# Patient Record
Sex: Male | Born: 1990 | Race: White | Hispanic: No | Marital: Single | Smoking: Former smoker
Health system: Southern US, Community
[De-identification: ages and names within clinical notes are randomized; demographics above are authoritative.]

---
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
## 2012-01-01 ENCOUNTER — Emergency Department (HOSPITAL_COMMUNITY): Payer: Medicaid Other

## 2012-01-01 ENCOUNTER — Emergency Department (HOSPITAL_COMMUNITY)
Admission: EM | Admit: 2012-01-01 | Discharge: 2012-01-01 | Disposition: A | Discharge status: Home / Self Care | Payer: Medicaid Other | Attending: Emergency Medicine | Admitting: Emergency Medicine

## 2012-01-01 ENCOUNTER — Encounter (HOSPITAL_COMMUNITY): Payer: Self-pay | Admitting: Family Medicine

## 2012-01-01 DIAGNOSIS — M25521 Pain in right elbow: Secondary | ICD-10-CM

## 2012-01-01 DIAGNOSIS — M19039 Primary osteoarthritis, unspecified wrist: Secondary | ICD-10-CM | POA: Insufficient documentation

## 2012-01-01 DIAGNOSIS — F17200 Nicotine dependence, unspecified, uncomplicated: Secondary | ICD-10-CM | POA: Insufficient documentation

## 2012-01-01 DIAGNOSIS — J45909 Unspecified asthma, uncomplicated: Secondary | ICD-10-CM | POA: Insufficient documentation

## 2012-01-01 MED ORDER — IBUPROFEN 800 MG PO TABS
800.0000 mg | ORAL_TABLET | Freq: Once | ORAL | Status: AC
  Administered 2012-01-01: 800 mg via ORAL
  Filled 2012-01-01: qty 1
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
## 2012-01-01 NOTE — ED Provider Notes (Signed)
Medical screening examination/treatment/procedure(s) were performed by non-physician practitioner and as supervising physician I was immediately available for consultation/collaboration.  Militza Devery M Cortne Amara, MD 01/01/12 2053 
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
## 2013-10-14 IMAGING — CR DG ELBOW COMPLETE 3+V*R*
4 series · 4 of 4 positions shown · non-contrast
Comparison: None.

CLINICAL DATA: Right elbow pain; twisting injury.

RIGHT ELBOW - COMPLETE 3+ VIEW

[x elbow ap right]
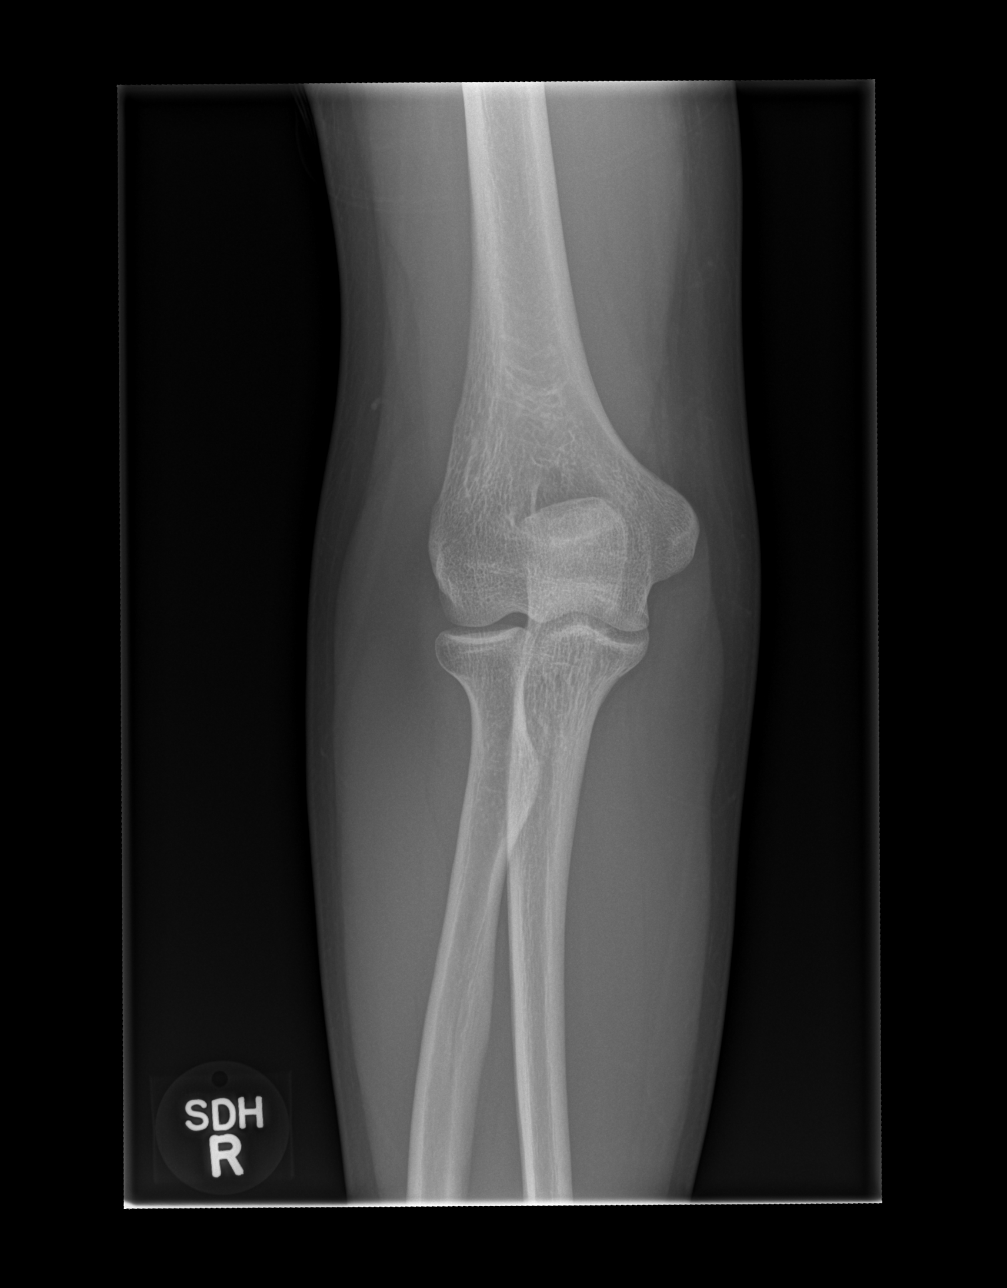

[x elbow obl right (1 of 2)]
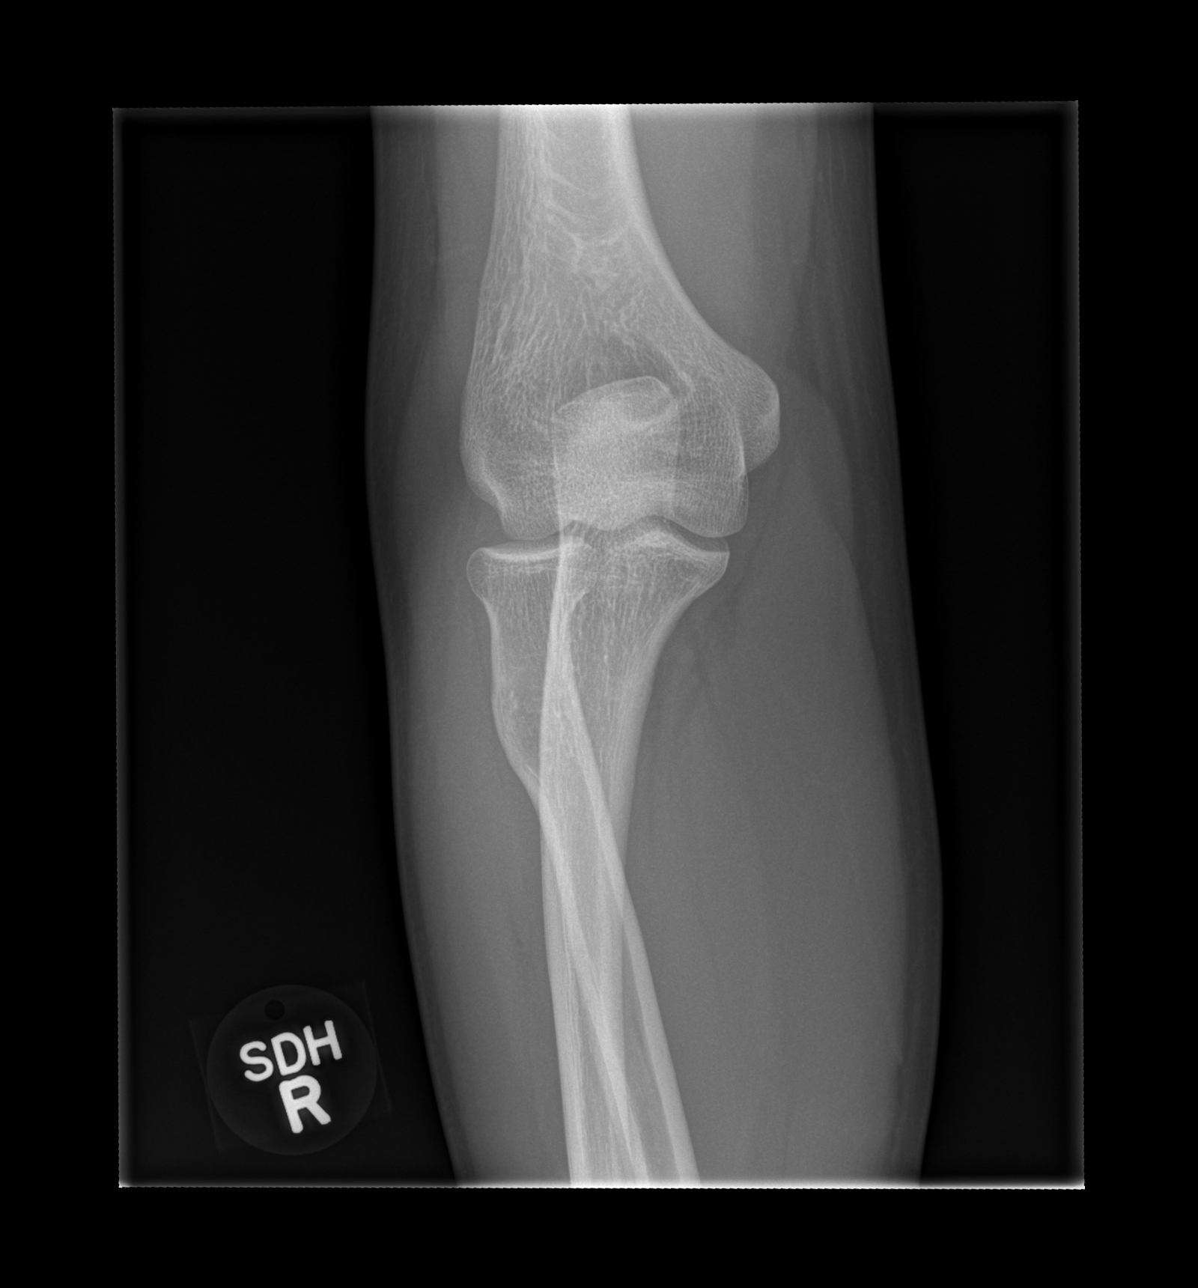

[x elbow obl right (2 of 2)]
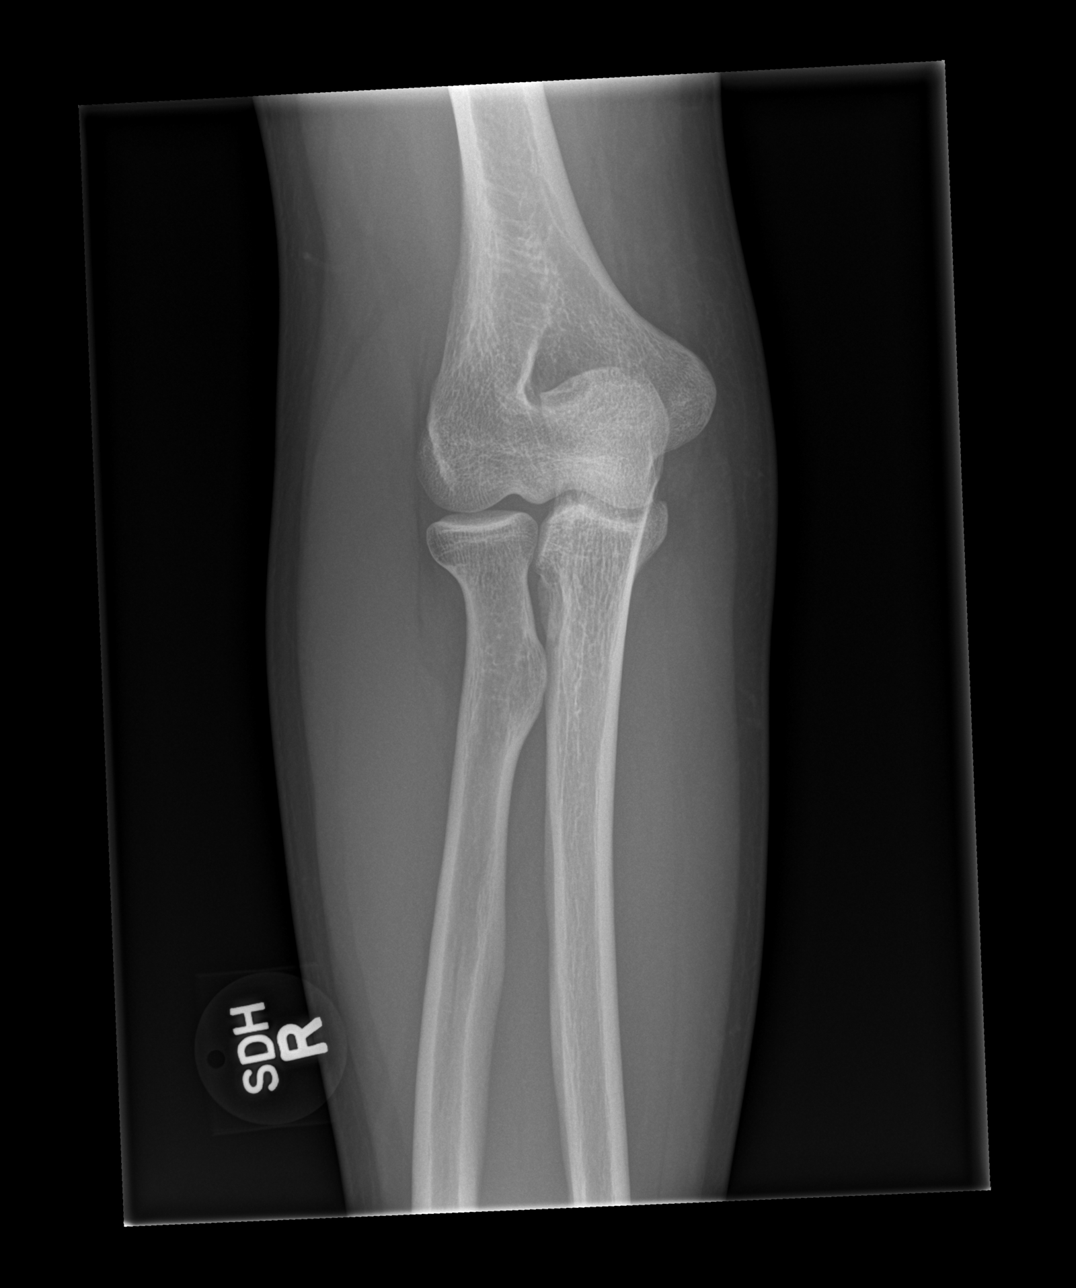

[x elbow lat right]
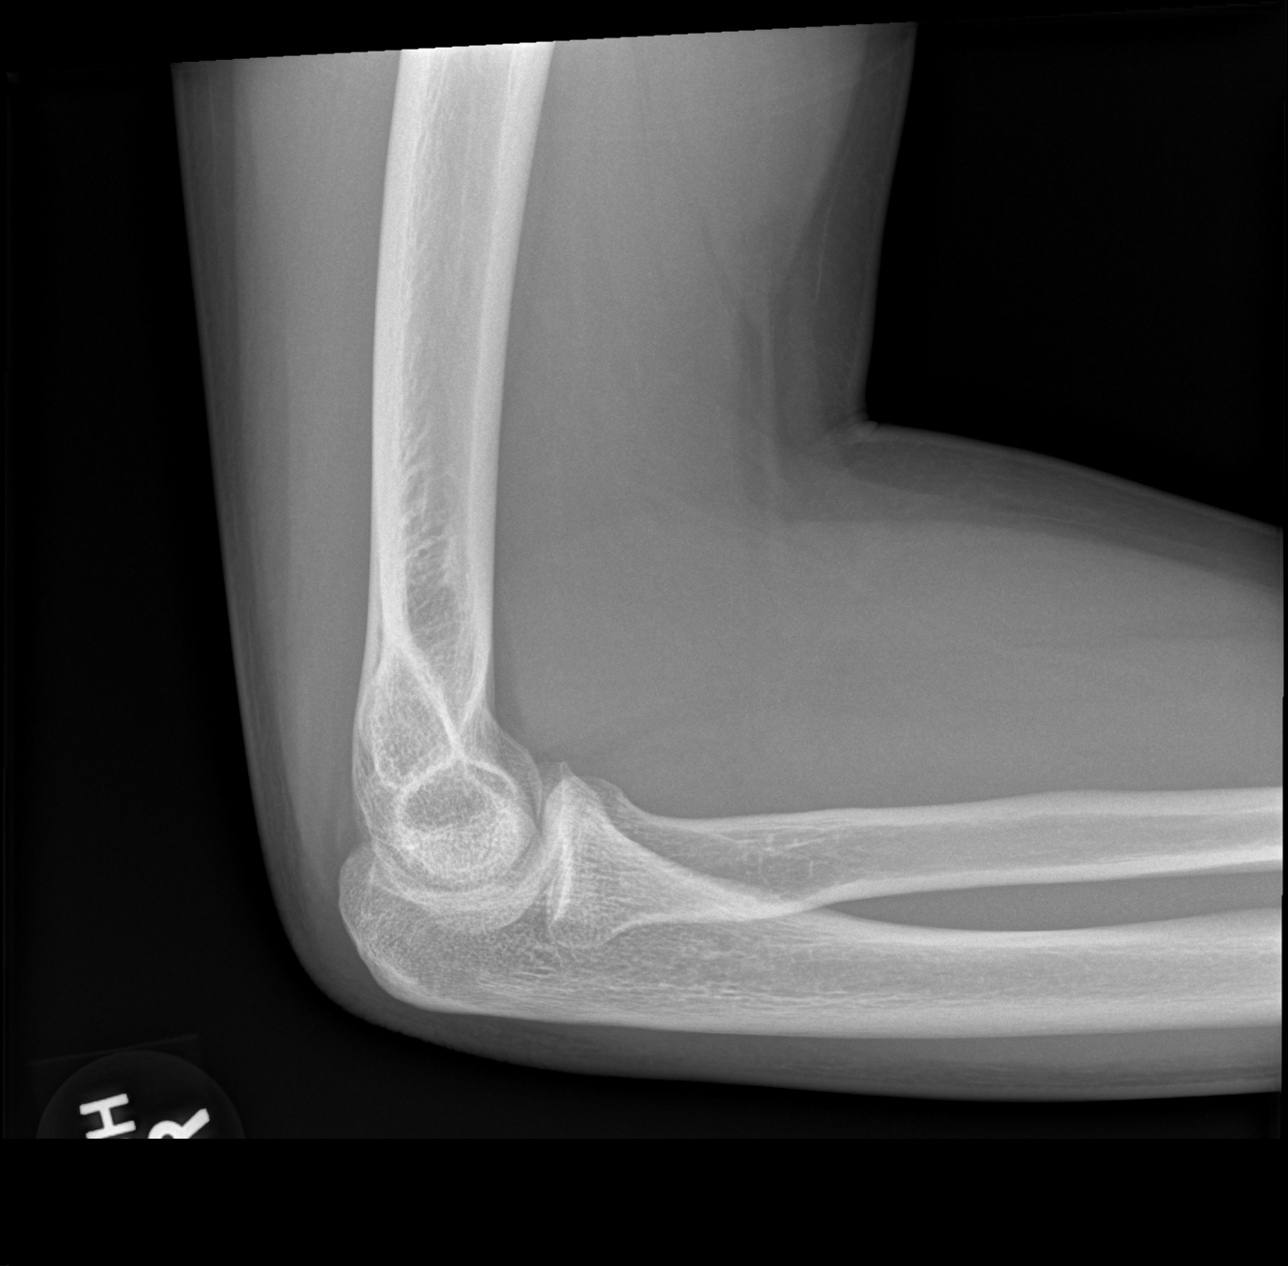

[4 of 4 positions shown; findings below may reference images not displayed]

FINDINGS: There is no evidence of fracture or dislocation.  The
visualized joint spaces are preserved.  No significant joint
effusion is identified.  The soft tissues are unremarkable in
appearance.
IMPRESSION: No evidence of fracture or dislocation.

## 2013-12-18 ENCOUNTER — Ambulatory Visit: Payer: Medicaid Other | Admitting: Internal Medicine

## 2014-08-03 ENCOUNTER — Emergency Department (HOSPITAL_COMMUNITY): Payer: Medicaid Other

## 2014-08-03 ENCOUNTER — Emergency Department (HOSPITAL_COMMUNITY)
Admission: EM | Admit: 2014-08-03 | Discharge: 2014-08-03 | Disposition: A | Discharge status: Home / Self Care | Payer: Medicaid Other | Attending: Emergency Medicine | Admitting: Emergency Medicine

## 2014-08-03 ENCOUNTER — Encounter (HOSPITAL_COMMUNITY): Payer: Self-pay | Admitting: *Deleted

## 2014-08-03 DIAGNOSIS — Y288XXA Contact with other sharp object, undetermined intent, initial encounter: Secondary | ICD-10-CM | POA: Insufficient documentation

## 2014-08-03 DIAGNOSIS — S91311A Laceration without foreign body, right foot, initial encounter: Secondary | ICD-10-CM

## 2014-08-03 DIAGNOSIS — Z720 Tobacco use: Secondary | ICD-10-CM | POA: Insufficient documentation

## 2014-08-03 DIAGNOSIS — Y998 Other external cause status: Secondary | ICD-10-CM | POA: Insufficient documentation

## 2014-08-03 DIAGNOSIS — Y9302 Activity, running: Secondary | ICD-10-CM | POA: Insufficient documentation

## 2014-08-03 DIAGNOSIS — Y9289 Other specified places as the place of occurrence of the external cause: Secondary | ICD-10-CM | POA: Insufficient documentation

## 2014-08-03 DIAGNOSIS — S91011A Laceration without foreign body, right ankle, initial encounter: Secondary | ICD-10-CM | POA: Insufficient documentation

## 2014-08-03 DIAGNOSIS — J45909 Unspecified asthma, uncomplicated: Secondary | ICD-10-CM | POA: Insufficient documentation

## 2014-08-03 MED ORDER — KETOROLAC TROMETHAMINE 60 MG/2ML IM SOLN
60.0000 mg | Freq: Once | INTRAMUSCULAR | Status: AC
  Administered 2014-08-03: 60 mg via INTRAMUSCULAR
  Filled 2014-08-03: qty 2

## 2014-08-03 MED ORDER — CEPHALEXIN 500 MG PO CAPS: 500.0000 mg | ORAL_CAPSULE | Freq: Four times a day (QID) | ORAL | Status: DC

## 2014-08-03 MED ORDER — BACITRACIN 500 UNIT/GM EX OINT
1.0000 "application " | TOPICAL_OINTMENT | Freq: Once | CUTANEOUS | Status: AC
  Administered 2014-08-03: 1 via TOPICAL

## 2014-08-03 MED ORDER — TETANUS-DIPHTH-ACELL PERTUSSIS 5-2.5-18.5 LF-MCG/0.5 IM SUSP
0.5000 mL | Freq: Once | INTRAMUSCULAR | Status: AC
  Administered 2014-08-03: 0.5 mL via INTRAMUSCULAR
  Filled 2014-08-03: qty 0.5
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
## 2014-08-03 NOTE — ED Notes (Signed)
Pt verbalizes understanding of d/c instructions and denies any further need at this time. 

## 2014-08-03 NOTE — ED Notes (Signed)
Pt states sore to R ankle x 3 days.  Mild red wound scabbed over immediately below R lat ankle.

## 2014-08-03 NOTE — Discharge Instructions (Signed)
Return to the emergency room with worsening of symptoms, new symptoms or with symptoms that are concerning, especially fevers, redness, swelling, red streaks, generalized unwell feeling. Read below information and follow recommendations. Laceration Care, Adult A laceration is a cut or lesion that goes through all layers of the skin and into the tissue just beneath the skin. TREATMENT  Some lacerations may not require closure. Some lacerations may not be able to be closed due to an increased risk of infection. It is important to see your caregiver as soon as possible after an injury to minimize the risk of infection and maximize the opportunity for successful closure. If closure is appropriate, pain medicines may be given, if needed. The wound will be cleaned to help prevent infection. Your caregiver will use stitches (sutures), staples, wound glue (adhesive), or skin adhesive strips to repair the laceration. These tools bring the skin edges together to allow for faster healing and a better cosmetic outcome. However, all wounds will heal with a scar. Once the wound has healed, scarring can be minimized by covering the wound with sunscreen during the day for 1 full year. HOME CARE INSTRUCTIONS  For sutures or staples:  Keep the wound clean and dry.  If you were given a bandage (dressing), you should change it at least once a day. Also, change the dressing if it becomes wet or dirty, or as directed by your caregiver.  Wash the wound with soap and water 2 times a day. Rinse the wound off with water to remove all soap. Pat the wound dry with a clean towel.  After cleaning, apply a thin layer of the antibiotic ointment as recommended by your caregiver. This will help prevent infection and keep the dressing from sticking.  You may shower as usual after the first 24 hours. Do not soak the wound in water until the sutures are removed.  Only take over-the-counter or prescription medicines for pain,  discomfort, or fever as directed by your caregiver.  Get your sutures or staples removed as directed by your caregiver. For skin adhesive strips:  Keep the wound clean and dry.  Do not get the skin adhesive strips wet. You may bathe carefully, using caution to keep the wound dry.  If the wound gets wet, pat it dry with a clean towel.  Skin adhesive strips will fall off on their own. You may trim the strips as the wound heals. Do not remove skin adhesive strips that are still stuck to the wound. They will fall off in time. For wound adhesive:  You may briefly wet your wound in the shower or bath. Do not soak or scrub the wound. Do not swim. Avoid periods of heavy perspiration until the skin adhesive has fallen off on its own. After showering or bathing, gently pat the wound dry with a clean towel.  Do not apply liquid medicine, cream medicine, or ointment medicine to your wound while the skin adhesive is in place. This may loosen the film before your wound is healed.  If a dressing is placed over the wound, be careful not to apply tape directly over the skin adhesive. This may cause the adhesive to be pulled off before the wound is healed.  Avoid prolonged exposure to sunlight or tanning lamps while the skin adhesive is in place. Exposure to ultraviolet light in the first year will darken the scar.  The skin adhesive will usually remain in place for 5 to 10 days, then naturally fall off the skin.  Do not pick at the adhesive film. You may need a tetanus shot if:  You cannot remember when you had your last tetanus shot.  You have never had a tetanus shot. If you get a tetanus shot, your arm may swell, get red, and feel warm to the touch. This is common and not a problem. If you need a tetanus shot and you choose not to have one, there is a rare chance of getting tetanus. Sickness from tetanus can be serious. SEEK MEDICAL CARE IF:   You have redness, swelling, or increasing pain in the  wound.  You see a red line that goes away from the wound.  You have yellowish-white fluid (pus) coming from the wound.  You have a fever.  You notice a bad smell coming from the wound or dressing.  Your wound breaks open before or after sutures have been removed.  You notice something coming out of the wound such as wood or glass.  Your wound is on your hand or foot and you cannot move a finger or toe. SEEK IMMEDIATE MEDICAL CARE IF:   Your pain is not controlled with prescribed medicine.  You have severe swelling around the wound causing pain and numbness or a change in color in your arm, hand, leg, or foot.  Your wound splits open and starts bleeding.  You have worsening numbness, weakness, or loss of function of any joint around or beyond the wound.  You develop painful lumps near the wound or on the skin anywhere on your body. MAKE SURE YOU:   Understand these instructions.  Will watch your condition.  Will get help right away if you are not doing well or get worse. Document Released: 01/18/2005 Document Revised: 04/12/2011 Document Reviewed: 07/14/2010 [facility] Patient Information 2015 [place], [place]. This information is not intended to replace advice given to you by your health care provider. Make sure you discuss any questions you have with your health care provider.

## 2014-08-03 NOTE — ED Notes (Signed)
The patient is back from xray.

## 2015-03-13 ENCOUNTER — Emergency Department (INDEPENDENT_AMBULATORY_CARE_PROVIDER_SITE_OTHER)
Admission: EM | Admit: 2015-03-13 | Discharge: 2015-03-13 | Disposition: A | Discharge status: Home / Self Care | Payer: Self-pay | Source: Home / Self Care | Attending: Family Medicine | Admitting: Family Medicine

## 2015-03-13 ENCOUNTER — Encounter (HOSPITAL_COMMUNITY): Payer: Self-pay | Admitting: *Deleted

## 2015-03-13 DIAGNOSIS — J069 Acute upper respiratory infection, unspecified: Secondary | ICD-10-CM

## 2015-03-13 NOTE — ED Provider Notes (Signed)
CSN: [id number]     Arrival date & time 03/13/15  1312 History   First MD Initiated Contact with Patient 03/13/15 1430     Chief Complaint  Patient presents with  . Cough   (Consider location/radiation/quality/duration/timing/severity/associated sxs/prior Treatment) HPI URI type symptoms for 2 days now. Runny nose cough fever. Symptomatic treatment at home with over-the-counter medications which are helping somewhat. Past Medical History  Diagnosis Date  . Asthma    History reviewed. No pertinent past surgical history. History reviewed. No pertinent family history. Social History  Substance Use Topics  . Smoking status: Current Every Day Smoker -- 1.50 packs/day    Types: Cigarettes  . Smokeless tobacco: None  . Alcohol Use: 1.8 oz/week    3 Cans of beer per week    Review of Systems ROS +'ve URI symptoms.  Denies: HEADACHE, NAUSEA, ABDOMINAL PAIN, CHEST PAIN, CONGESTION, DYSURIA, SHORTNESS OF BREATH  Allergies  Review of patient's allergies indicates no known allergies.  Home Medications   Prior to Admission medications   Medication Sig Start Date End Date Taking? Authorizing Provider  cephALEXin (KEFLEX) 500 MG capsule Take 1 capsule (500 mg total) by mouth 4 (four) times daily. 08/03/14   Oswaldo Conroy, PA-C  ibuprofen (ADVIL,MOTRIN) 600 MG tablet One (1) po q 6 hours prn, with food 01/28/11   Cathren Laine, MD   Meds Ordered and Administered this Visit  Medications - No data to display  BP 147/62 mmHg  Pulse 92  Temp(Src) 98.8 F (37.1 C) (Oral)  Resp 16  SpO2 99% No data found.   Physical Exam  Constitutional: He is oriented to person, place, and time. He appears well-developed and well-nourished.  HENT:  Head: Normocephalic and atraumatic.  Right Ear: External ear normal.  Left Ear: External ear normal.  Mouth/Throat: Oropharynx is clear and moist.  Eyes: Conjunctivae are normal.  Neck: Normal range of motion. Neck supple.  Pulmonary/Chest: Effort  normal and breath sounds normal.  Musculoskeletal: Normal range of motion.  Lymphadenopathy:    He has no cervical adenopathy.  Neurological: He is alert and oriented to person, place, and time.  Skin: Skin is warm and dry.  Psychiatric: He has a normal mood and affect. His behavior is normal.  Nursing note and vitals reviewed.   ED Course  Procedures (including critical care time)  Labs Review Labs Reviewed - No data to display  Imaging Review No results found.   Visual Acuity Review  Right Eye Distance:   Left Eye Distance:   Bilateral Distance:    Right Eye Near:   Left Eye Near:    Bilateral Near:         MDM   1. Acute URI    Patient is advised to continue home symptomatic treatment.  Patient is advised that if there are new or worsening symptoms or attend the emergency department, or contact primary care provider. Instructions of care provided discharged home in stable condition. Return to work/school note provided.  THIS NOTE WAS GENERATED USING A VOICE RECOGNITION SOFTWARE PROGRAM. ALL REASONABLE EFFORTS  WERE MADE TO PROOFREAD THIS DOCUMENT FOR ACCURACY.     Tharon Aquas, PA 03/13/15 2031

## 2015-03-13 NOTE — Discharge Instructions (Signed)
Upper Respiratory Infection, Adult °Most upper respiratory infections (URIs) are a viral infection of the air passages leading to the lungs. A URI affects the nose, throat, and upper air passages. The most common type of URI is nasopharyngitis and is typically referred to as "the common cold." °URIs run their course and usually go away on their own. Most of the time, a URI does not require medical attention, but sometimes a bacterial infection in the upper airways can follow a viral infection. This is called a secondary infection. Sinus and middle ear infections are common types of secondary upper respiratory infections. °Bacterial pneumonia can also complicate a URI. A URI can worsen asthma and chronic obstructive pulmonary disease (COPD). Sometimes, these complications can require emergency medical care and may be life threatening.  °CAUSES °Almost all URIs are caused by viruses. A virus is a type of germ and can spread from one person to another.  °RISKS FACTORS °You may be at risk for a URI if:  °· You smoke.   °· You have chronic heart or lung disease. °· You have a weakened defense (immune) system.   °· You are very young or very old.   °· You have nasal allergies or asthma. °· You work in crowded or poorly ventilated areas. °· You work in health care facilities or schools. °SIGNS AND SYMPTOMS  °Symptoms typically develop 2-3 days after you come in contact with a cold virus. Most viral URIs last 7-10 days. However, viral URIs from the influenza virus (flu virus) can last 14-18 days and are typically more severe. Symptoms may include:  °· Runny or stuffy (congested) nose.   °· Sneezing.   °· Cough.   °· Sore throat.   °· Headache.   °· Fatigue.   °· Fever.   °· Loss of appetite.   °· Pain in your forehead, behind your eyes, and over your cheekbones (sinus pain). °· Muscle aches.   °DIAGNOSIS  °Your health care provider may diagnose a URI by: °· Physical exam. °· Tests to check that your symptoms are not due to  another condition such as: °· Strep throat. °· Sinusitis. °· Pneumonia. °· Asthma. °TREATMENT  °A URI goes away on its own with time. It cannot be cured with medicines, but medicines may be prescribed or recommended to relieve symptoms. Medicines may help: °· Reduce your fever. °· Reduce your cough. °· Relieve nasal congestion. °HOME CARE INSTRUCTIONS  °· Take medicines only as directed by your health care provider.   °· Gargle warm saltwater or take cough drops to comfort your throat as directed by your health care provider. °· Use a warm mist humidifier or inhale steam from a shower to increase air moisture. This may make it easier to breathe. °· Drink enough fluid to keep your urine clear or pale yellow.   °· Eat soups and other clear broths and maintain good nutrition.   °· Rest as needed.   °· Return to work when your temperature has returned to normal or as your health care provider advises. You may need to stay home longer to avoid infecting others. You can also use a face mask and careful hand washing to prevent spread of the virus. °· Increase the usage of your inhaler if you have asthma.   °· Do not use any tobacco products, including cigarettes, chewing tobacco, or electronic cigarettes. If you need help quitting, ask your health care provider. °PREVENTION  °The best way to protect yourself from getting a cold is to practice good hygiene.  °· Avoid oral or hand contact with people with cold   symptoms.   °· Wash your hands often if contact occurs.   °There is no clear evidence that vitamin C, vitamin E, echinacea, or exercise reduces the chance of developing a cold. However, it is always recommended to get plenty of rest, exercise, and practice good nutrition.  °SEEK MEDICAL CARE IF:  °· You are getting worse rather than better.   °· Your symptoms are not controlled by medicine.   °· You have chills. °· You have worsening shortness of breath. °· You have brown or red mucus. °· You have yellow or brown nasal  discharge. °· You have pain in your face, especially when you bend forward. °· You have a fever. °· You have swollen neck glands. °· You have pain while swallowing. °· You have white areas in the back of your throat. °SEEK IMMEDIATE MEDICAL CARE IF:  °· You have severe or persistent: °¨ Headache. °¨ Ear pain. °¨ Sinus pain. °¨ Chest pain. °· You have chronic lung disease and any of the following: °¨ Wheezing. °¨ Prolonged cough. °¨ Coughing up blood. °¨ A change in your usual mucus. °· You have a stiff neck. °· You have changes in your: °¨ Vision. °¨ Hearing. °¨ Thinking. °¨ Mood. °MAKE SURE YOU:  °· Understand these instructions. °· Will watch your condition. °· Will get help right away if you are not doing well or get worse. °  °This information is not intended to replace advice given to you by your health care provider. Make sure you discuss any questions you have with your health care provider. °  °Document Released: 07/14/2000 Document Revised: 06/04/2014 Document Reviewed: 04/25/2013 °Elsevier Interactive Patient Education ©2016 [employer] ° °Cough, Adult °A cough helps to clear your throat and lungs. A cough may last only 2-3 weeks (acute), or it may last longer than 8 weeks (chronic). Many different things can cause a cough. A cough may be a sign of an illness or another medical condition. °HOME CARE °· Pay attention to any changes in your cough. °· Take medicines only as told by your doctor. °¨ If you were prescribed an antibiotic medicine, take it as told by your doctor. Do not stop taking it even if you start to feel better. °¨ Talk with your doctor before you try using a cough medicine. °· Drink enough fluid to keep your pee (urine) clear or pale yellow. °· If the air is dry, use a cold steam vaporizer or humidifier in your home. °· Stay away from things that make you cough at work or at home. °· If your cough is worse at night, try using extra pillows to raise your head up higher while you  sleep. °· Do not smoke, and try not to be around smoke. If you need help quitting, ask your doctor. °· Do not have caffeine. °· Do not drink alcohol. °· Rest as needed. °GET HELP IF: °· You have new problems (symptoms). °· You cough up yellow fluid (pus). °· Your cough does not get better after 2-3 weeks, or your cough gets worse. °· Medicine does not help your cough and you are not sleeping well. °· You have pain that gets worse or pain that is not helped with medicine. °· You have a fever. °· You are losing weight and you do not know why. °· You have night sweats. °GET HELP RIGHT AWAY IF: °· You cough up blood. °· You have trouble breathing. °· Your heartbeat is very fast. °  °This information is not intended to replace   advice given to you by your health care provider. Make sure you discuss any questions you have with your health care provider. °  °Document Released: 10/01/2010 Document Revised: 10/09/2014 Document Reviewed: 03/27/2014 °Elsevier Interactive Patient Education ©2016 [employer] ° °

## 2015-03-13 NOTE — ED Notes (Signed)
Pt  Reports      Symptoms    Of   Cough    boody  Aches      Chest pain  With  Coughing  With  Onset  Of  Symptoms  For  Several  Days            pt  Reports  The  Cough  Is  For  The  Most  Part  Productive         He  Is  A  Smoker   1/2  Ppd    hwe is  Sitting  Upright  On  The  Exam table  Speaking in  Complete  sentances

## 2015-06-18 ENCOUNTER — Encounter (HOSPITAL_COMMUNITY): Payer: Self-pay | Admitting: Emergency Medicine

## 2015-06-18 ENCOUNTER — Ambulatory Visit (HOSPITAL_COMMUNITY)
Admission: EM | Admit: 2015-06-18 | Discharge: 2015-06-18 | Disposition: A | Discharge status: Home / Self Care | Payer: 59 | Attending: Emergency Medicine | Admitting: Emergency Medicine

## 2015-06-18 DIAGNOSIS — H6502 Acute serous otitis media, left ear: Secondary | ICD-10-CM

## 2015-06-18 DIAGNOSIS — J40 Bronchitis, not specified as acute or chronic: Secondary | ICD-10-CM

## 2015-06-18 MED ORDER — IPRATROPIUM-ALBUTEROL 0.5-2.5 (3) MG/3ML IN SOLN
3.0000 mL | Freq: Once | RESPIRATORY_TRACT | Status: AC
  Administered 2015-06-18: 3 mL via RESPIRATORY_TRACT

## 2015-06-18 MED ORDER — AMOXICILLIN 500 MG PO CAPS: 500.0000 mg | ORAL_CAPSULE | Freq: Three times a day (TID) | ORAL | Status: DC

## 2015-06-18 MED ORDER — PREDNISONE 50 MG PO TABS: ORAL_TABLET | ORAL | Status: DC

## 2015-06-18 MED ORDER — IPRATROPIUM-ALBUTEROL 0.5-2.5 (3) MG/3ML IN SOLN
RESPIRATORY_TRACT | Status: AC
  Filled 2015-06-18: qty 3

## 2015-06-18 MED ORDER — ALBUTEROL SULFATE HFA 108 (90 BASE) MCG/ACT IN AERS: 2.0000 | INHALATION_SPRAY | RESPIRATORY_TRACT | Status: DC | PRN

## 2015-06-18 NOTE — Discharge Instructions (Signed)
You have bronchitis. Take amoxicillin and prednisone as prescribed. Use the albuterol every 4 hours as needed for wheezing or cough. You also have some fluid behind your left ear. This is likely contributing to the dizziness you had today. The amoxicillin will help with this. You should see improvement in the next 3-5 days. If you develop fevers, difficulty breathing, or are just not getting better, please come back or go to the emergency room.

## 2015-06-18 NOTE — ED Notes (Signed)
Pt has been suffering from head congestion and a cough for two days.  Today he stated he felt very dizzy and thought he was going to pass out at work.  He does not know if he has had a fever the last two days, but his temperature today is 100.5 with no medications taken.

## 2015-06-18 NOTE — ED Provider Notes (Signed)
CSN: [id number]     Arrival date & time 06/18/15  1744 History   First MD Initiated Contact with Patient 06/18/15 1916     Chief Complaint  Patient presents with  . URI  . Dizziness   (Consider location/radiation/quality/duration/timing/severity/associated sxs/prior Treatment) HPI He is a 25 year old man here for evaluation of cough and dizziness. His symptoms started 2 days ago with mild nasal congestion, rhinorrhea and cough. His symptoms worsened yesterday. He describes a spasmodic cough. He's also had some shortness of breath. He denies any wheezing. He reports feeling feverish yesterday, but did not take a temperature. Today at work he had an episode of dizziness. He states it felt like the room was spinning. It was associated with some mild nausea. It lasted about 3 minutes. He does have a history of asthma, but has not used an inhaler in several years. He is a current smoker.  Past Medical History  Diagnosis Date  . Asthma    History reviewed. No pertinent past surgical history. History reviewed. No pertinent family history. Social History  Substance Use Topics  . Smoking status: Current Every Day Smoker -- 1.00 packs/day    Types: Cigarettes  . Smokeless tobacco: None  . Alcohol Use: 1.8 oz/week    3 Cans of beer per week    Review of Systems As in history of present illness Allergies  Review of patient's allergies indicates no known allergies.  Home Medications   Prior to Admission medications   Medication Sig Start Date End Date Taking? Authorizing Provider  Pseudoeph-Doxylamine-DM-APAP (NYQUIL PO) Take by mouth.   Yes Historical Provider, MD  albuterol (PROVENTIL HFA;VENTOLIN HFA) 108 (90 Base) MCG/ACT inhaler Inhale 2 puffs into the lungs every 4 (four) hours as needed for wheezing or shortness of breath. 06/18/15   Charm Rings, MD  amoxicillin (AMOXIL) 500 MG capsule Take 1 capsule (500 mg total) by mouth 3 (three) times daily. 06/18/15   Charm Rings, MD  ibuprofen  (ADVIL,MOTRIN) 600 MG tablet One (1) po q 6 hours prn, with food 01/28/11   Cathren Laine, MD  predniSONE (DELTASONE) 50 MG tablet Take 1 pill daily for 5 days. 06/18/15   Charm Rings, MD   Meds Ordered and Administered this Visit   Medications  ipratropium-albuterol (DUONEB) 0.5-2.5 (3) MG/3ML nebulizer solution 3 mL (3 mLs Nebulization Given 06/18/15 1935)    BP 122/79 mmHg  Pulse 98  Temp(Src) 100.5 F (38.1 C) (Oral)  SpO2 99% No data found.   Physical Exam  Constitutional: He is oriented to person, place, and time. He appears well-developed and well-nourished. No distress.  HENT:  Mouth/Throat: No oropharyngeal exudate.  Oropharynx is erythematous. Nasal mucosa is erythematous and boggy. Right TM is normal. Left TM is erythematous with a clear effusion.  Neck: Neck supple.  Cardiovascular: Normal rate, regular rhythm and normal heart sounds.   No murmur heard. Pulmonary/Chest: Effort normal and breath sounds normal. No respiratory distress. He has no wheezes. He has no rales.  He does have a spastic cough  Lymphadenopathy:    He has no cervical adenopathy.  Neurological: He is alert and oriented to person, place, and time.    ED Course  Procedures (including critical care time)  Labs Review Labs Reviewed - No data to display  Imaging Review No results found.   MDM   1. Bronchitis   2. Acute serous otitis media of left ear, recurrence not specified    Reports subjective improvement after DuoNeb. Cough  does sound looser after DuoNeb.  Treat with amoxicillin, prednisone, and albuterol. Follow-up as needed.    Charm RingsErin J Aisling Emigh, MD 06/18/15 2009

## 2016-05-16 IMAGING — CR DG FOOT COMPLETE 3+V*R*
3 series · 3 of 3 positions shown · non-contrast
Comparison: Right foot radiographs performed 08/25/2004

CLINICAL DATA: Right lateral ankle laceration and pain. Initial
encounter.

EXAM:
RIGHT FOOT COMPLETE - 3+ VIEW

[x foot ap right]
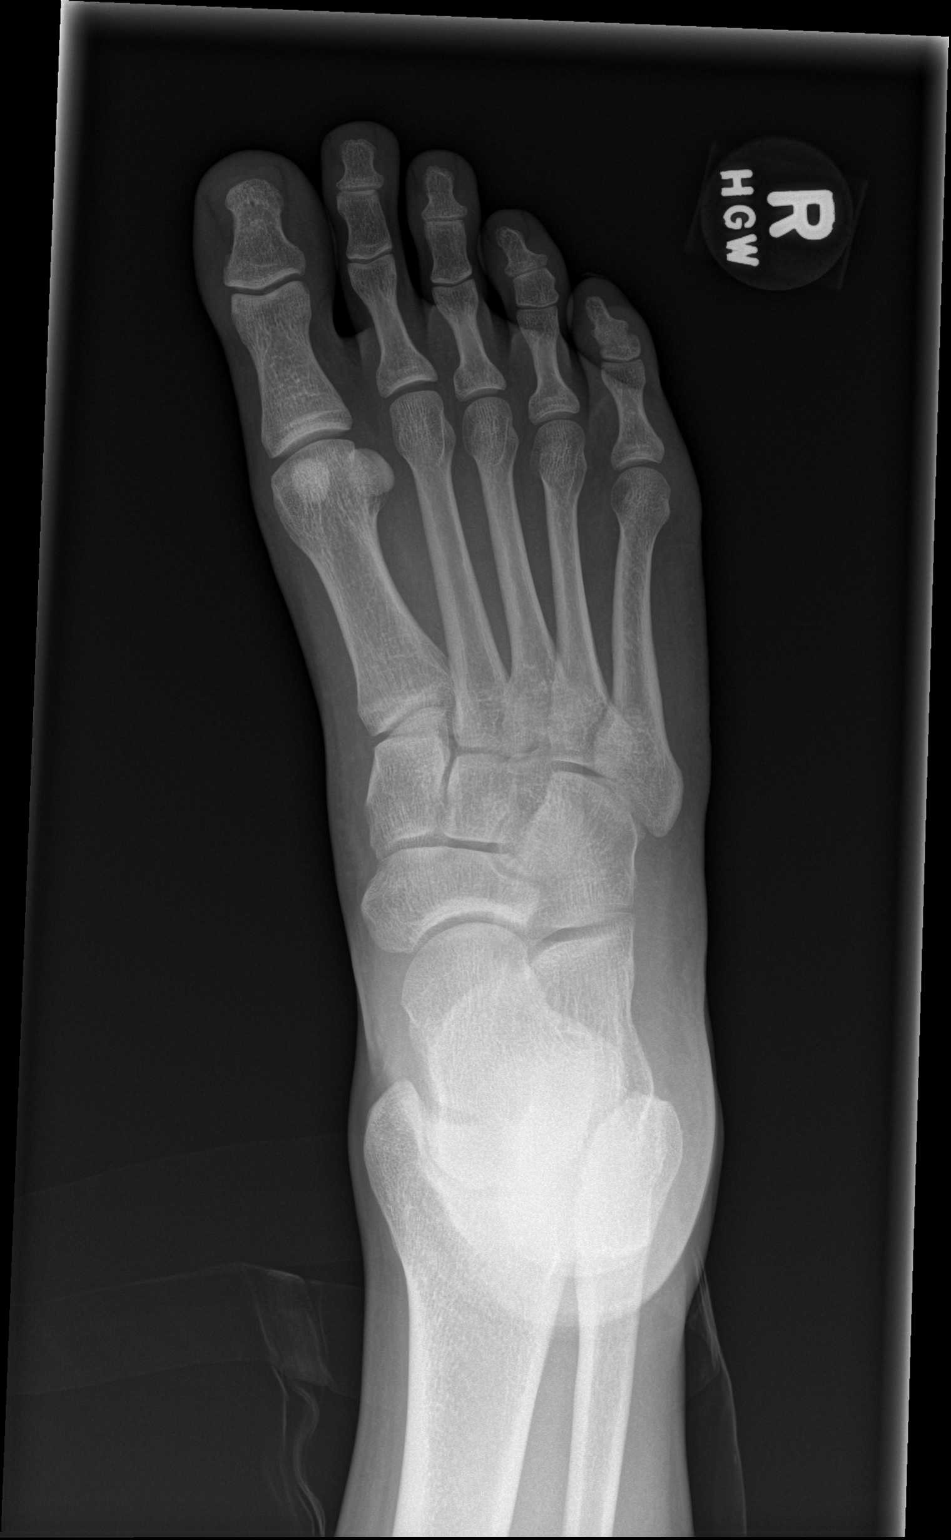

[x foot obl right]
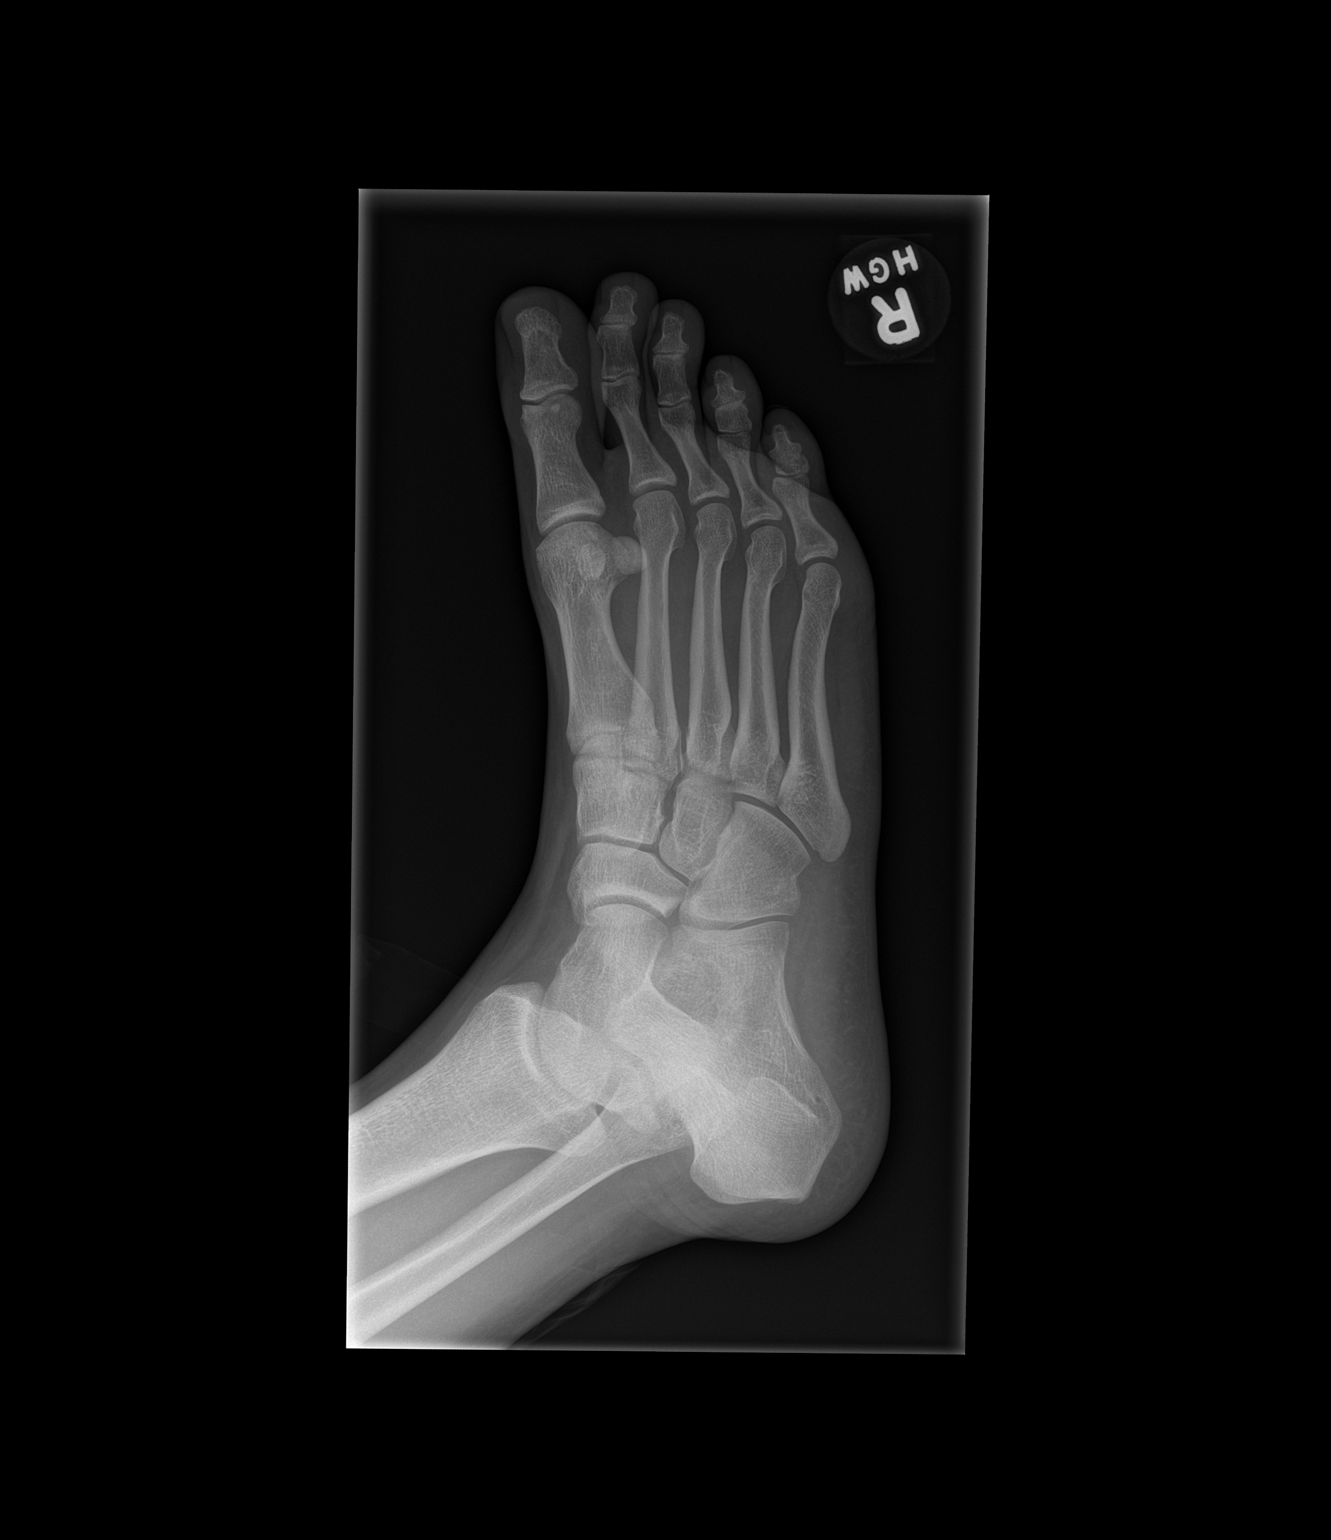

[x foot lat right]
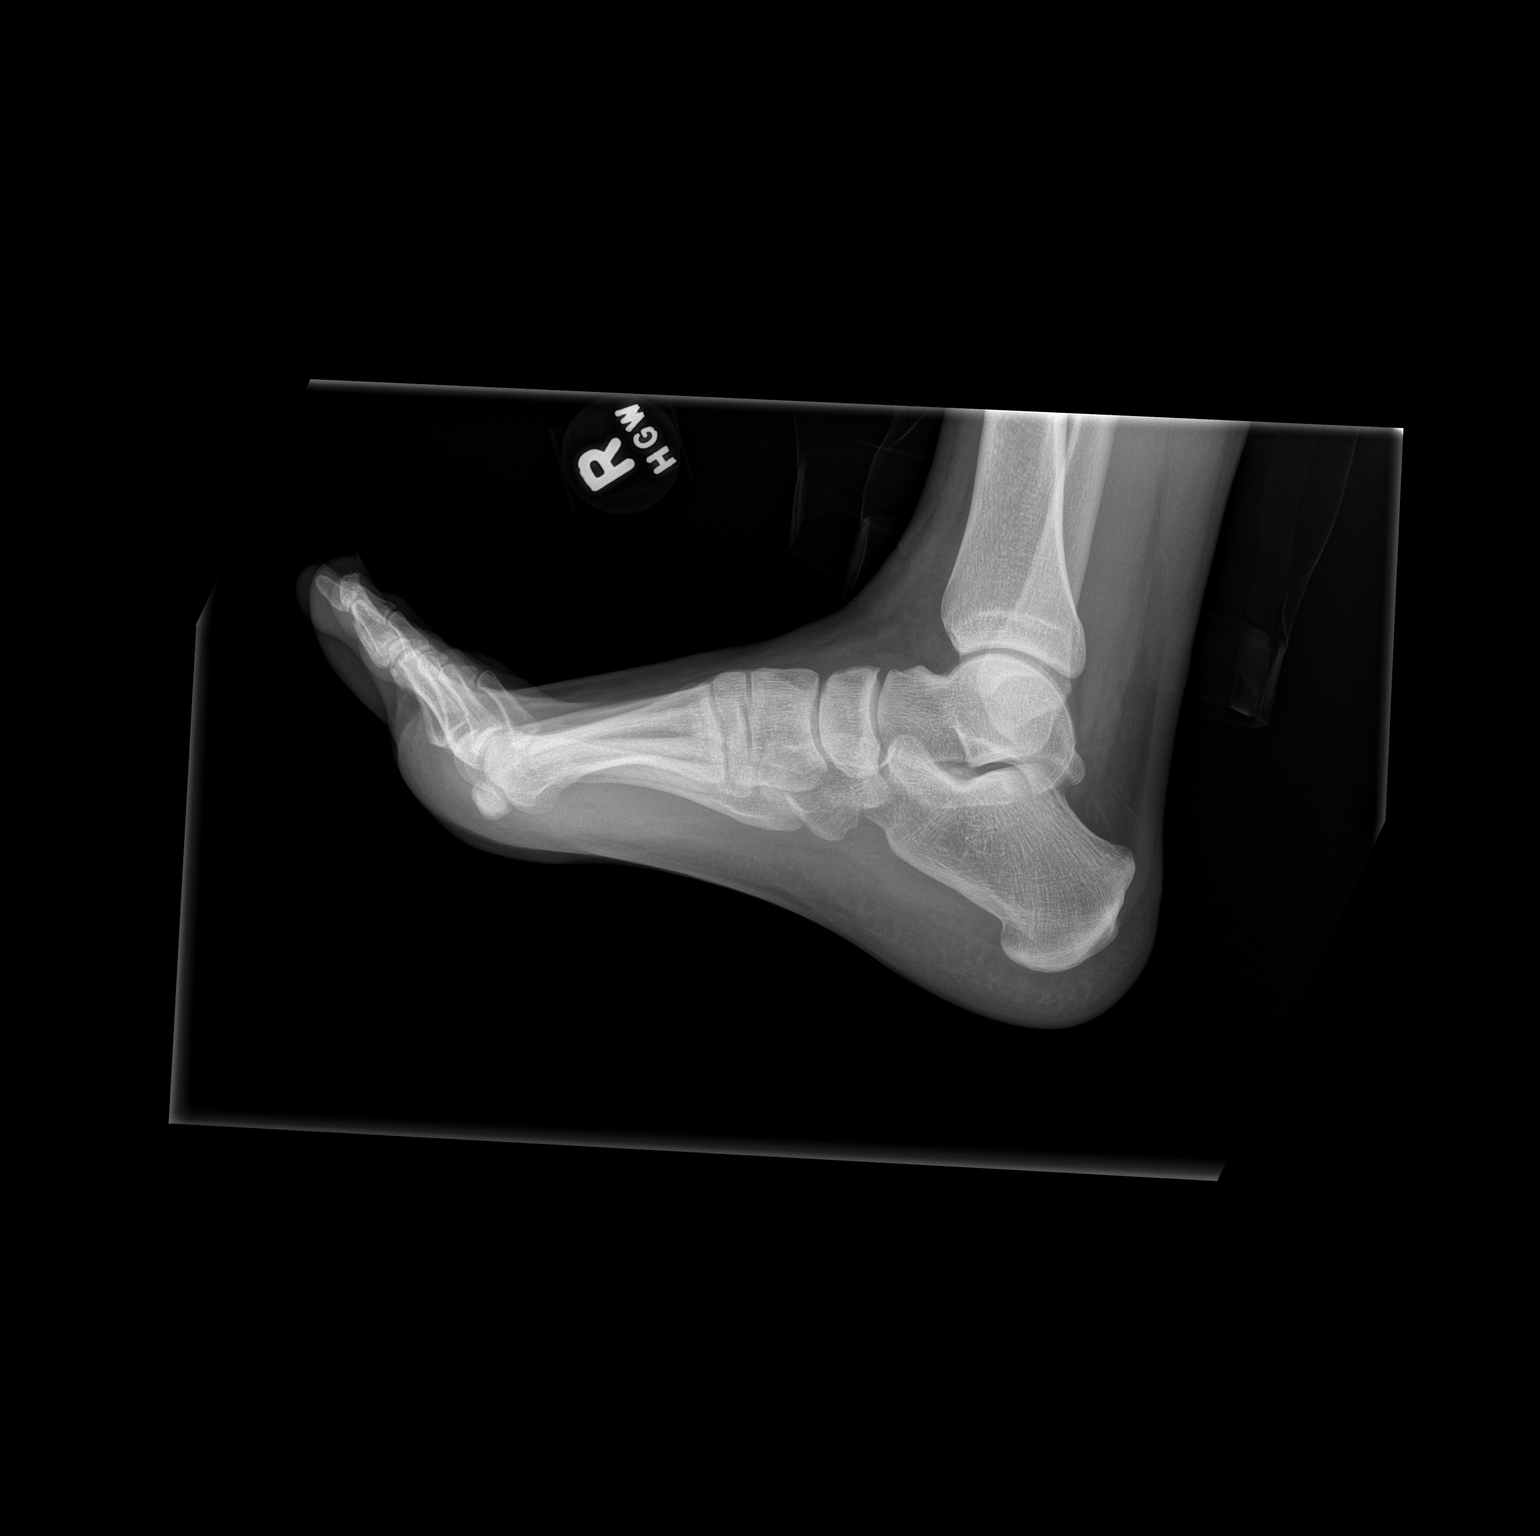

[3 of 3 positions shown; findings below may reference images not displayed]

FINDINGS: There is no evidence of fracture or dislocation. The joint spaces
are preserved. There is no evidence of talar subluxation; the
subtalar joint is unremarkable in appearance.

No significant soft tissue abnormalities are seen.
IMPRESSION: No evidence of fracture or dislocation.

## 2020-08-22 ENCOUNTER — Other Ambulatory Visit: Payer: Self-pay

## 2020-08-22 ENCOUNTER — Ambulatory Visit
Admission: EM | Admit: 2020-08-22 | Discharge: 2020-08-22 | Disposition: A | Discharge status: Home / Self Care | Payer: Commercial Managed Care - PPO | Attending: Student | Admitting: Student

## 2020-08-22 DIAGNOSIS — K0889 Other specified disorders of teeth and supporting structures: Secondary | ICD-10-CM

## 2020-08-22 MED ORDER — AMOXICILLIN-POT CLAVULANATE 875-125 MG PO TABS: 1.0000 | ORAL_TABLET | Freq: Two times a day (BID) | ORAL | 0 refills | Status: DC

## 2020-08-22 MED ORDER — IBUPROFEN 800 MG PO TABS: 800.0000 mg | ORAL_TABLET | Freq: Three times a day (TID) | ORAL | 0 refills | Status: DC

## 2020-08-22 MED ORDER — LIDOCAINE VISCOUS HCL 2 % MT SOLN: 15.0000 mL | OROMUCOSAL | 0 refills | Status: DC | PRN
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
## 2020-08-22 NOTE — Discharge Instructions (Addendum)
-  Start the antibiotic-Augmentin (amoxicillin-clavulanate), 1 pill every 12 hours for 7 days.  You can take this with food like with breakfast and dinner. -For dental pain use lidocaine mouthwash up to every 4 hours. Make sure not to eat for at least 1 hour after using this, as your mouth will be very numb and you could bite yourself. -For pain you can take Tylenol 1000 mg 3 times daily, and ibuprofen 800 mg 3 times daily with food.  You can take these together, or alternate every 3-4 hours. -Try to make a follow-up appointment with dentist, resource guide later in paperwork.  -Seek additional medical attention if symptoms worsen instead of improve- pain of mouth, pain under tongue, pain with chewing/moving jaw, new fevers/chills, etc.

## 2023-10-07 ENCOUNTER — Ambulatory Visit: Payer: Self-pay

## 2023-10-07 NOTE — Telephone Encounter (Signed)
 FYI Only or Action Required?: FYI only for provider.  Called Nurse Triage reporting Rectal Bleeding.  Symptoms began 1.5 months ago.  Interventions attempted: Nothing.  Symptoms are: unchanged.  Triage Disposition: See PCP Within 2 Weeks  Patient/caregiver understands and will follow disposition?: Yes       Copied from CRM [phone]. Topic: Clinical - Red Word Triage >> Oct 07, 2023  1:54 PM Marissa P wrote: Red Word that prompted transfer to Nurse Triage: Patient is having blood in stools and some mild pain, would also like to get established care         Reason for Disposition  [1] Rectal bleeding is minimal (e.g., blood just on toilet paper, few drops, streaks on surface of normal formed BM) AND [2] bleeding recurs 3 or more times after using Care Advice  Answer Assessment - Initial Assessment Questions 1. APPEARANCE of BLOOD: What color is it? Is it passed separately, on the surface of the stool, or mixed in with the stool?      Varies, either on toiler paper or drops and streaks 2. AMOUNT: How much blood was passed?      Small amount  3. FREQUENCY: How many times has blood been passed with the stools?      Intermittent 4. ONSET: When was the blood first seen in the stools? (Days or weeks)      1.5 months  5. DIARRHEA: Is there also some diarrhea? If Yes, ask: How many diarrhea stools in the past 24 hours?      Yes, once  6. CONSTIPATION: Do you have constipation? If Yes, ask: How bad is it?     No 7. RECURRENT SYMPTOMS: Have you had blood in your stools before? If Yes, ask: When was the last time? and What happened that time?      Yes, had similar symptoms last year and was seen at urgent care  8. BLOOD THINNERS: Do you take any blood thinners? (e.g., aspirin, clopidogrel / Plavix, coumadin, heparin). Notes: Other strong blood thinners include: Arixtra (fondaparinux), Eliquis (apixaban), Pradaxa (dabigatran), and Xarelto (rivaroxaban).      No 9. OTHER SYMPTOMS: Do you have any other symptoms?  (e.g., abdomen pain, vomiting, dizziness, fever)     Left sided abdominal pain  Protocols used: Rectal Bleeding-A-AH

## 2023-10-12 ENCOUNTER — Encounter (HOSPITAL_BASED_OUTPATIENT_CLINIC_OR_DEPARTMENT_OTHER): Payer: Self-pay

## 2023-10-12 ENCOUNTER — Other Ambulatory Visit: Payer: Self-pay | Admitting: Family Medicine

## 2023-10-12 ENCOUNTER — Encounter: Payer: Self-pay | Admitting: Family Medicine

## 2023-10-12 ENCOUNTER — Ambulatory Visit: Payer: Self-pay | Admitting: Family Medicine

## 2023-10-12 ENCOUNTER — Telehealth: Payer: Self-pay

## 2023-10-12 ENCOUNTER — Telehealth: Payer: Self-pay | Admitting: Family Medicine

## 2023-10-12 ENCOUNTER — Ambulatory Visit (HOSPITAL_BASED_OUTPATIENT_CLINIC_OR_DEPARTMENT_OTHER)
Admission: RE | Admit: 2023-10-12 | Discharge: 2023-10-12 | Disposition: A | Discharge status: Home / Self Care | Source: Ambulatory Visit | Attending: Family Medicine | Admitting: Family Medicine

## 2023-10-12 ENCOUNTER — Ambulatory Visit: Admitting: Family Medicine

## 2023-10-12 VITALS — BP 122/82 | HR 84 | Temp 97.3°F | Ht 67.0 in | Wt 180.0 lb

## 2023-10-12 DIAGNOSIS — R1084 Generalized abdominal pain: Secondary | ICD-10-CM | POA: Diagnosis not present

## 2023-10-12 DIAGNOSIS — K921 Melena: Secondary | ICD-10-CM | POA: Insufficient documentation

## 2023-10-12 DIAGNOSIS — Z114 Encounter for screening for human immunodeficiency virus [HIV]: Secondary | ICD-10-CM | POA: Insufficient documentation

## 2023-10-12 DIAGNOSIS — Z1159 Encounter for screening for other viral diseases: Secondary | ICD-10-CM | POA: Insufficient documentation

## 2023-10-12 DIAGNOSIS — R519 Headache, unspecified: Secondary | ICD-10-CM | POA: Insufficient documentation

## 2023-10-12 DIAGNOSIS — Z8709 Personal history of other diseases of the respiratory system: Secondary | ICD-10-CM | POA: Insufficient documentation

## 2023-10-12 MED ORDER — NURTEC 75 MG PO TBDP: 75.0000 mg | ORAL_TABLET | Freq: Every day | ORAL | Status: DC | PRN

## 2023-10-12 MED ORDER — IOHEXOL 300 MG/ML  SOLN
100.0000 mL | Freq: Once | INTRAMUSCULAR | Status: AC | PRN
  Administered 2023-10-12: 100 mL via INTRAVENOUS

## 2023-10-12 MED ORDER — DOXYCYCLINE HYCLATE 100 MG PO TABS: 100.0000 mg | ORAL_TABLET | Freq: Two times a day (BID) | ORAL | 0 refills | Status: DC

## 2023-10-12 MED ORDER — CIPROFLOXACIN HCL 500 MG PO TABS: 500.0000 mg | ORAL_TABLET | Freq: Two times a day (BID) | ORAL | 0 refills | Status: AC

## 2023-10-12 NOTE — Assessment & Plan Note (Signed)
 Resolved since quitting smoking 2 years ago.

## 2023-10-12 NOTE — Patient Instructions (Addendum)
   VISIT SUMMARY: Today, you were seen for abdominal pain and blood in your stool, which you have been experiencing for about a month and a half. You also mentioned having daily headaches and a history of asthma. We discussed your symptoms and created a plan to address each of your concerns.  YOUR PLAN: -MELENA WITH GENERALIZED ABDOMINAL PAIN AND DIARRHEA: Melena refers to the presence of dark red blood in your stool, which can indicate gastrointestinal bleeding. To investigate this further, we will check your hemoglobin levels and perform a CT scan of your abdomen with contrast. You will also be referred to a gastrointestinal specialist for a possible colonoscopy. In the meantime, please avoid strenuous activities and consider using papaya enzyme to help with your digestive symptoms.  -HEADACHE: You have been experiencing daily headaches that are worsened by noise. These could be due to various factors such as dehydration, B12 deficiency, caffeine withdrawal, allergies, or stress. We provided you with a sample of Nurtec to help manage your headaches and advised you to use Flonase if allergies might be contributing to your symptoms.  INSTRUCTIONS: Please follow up with the lab for your hemoglobin test and schedule the CT scan of your abdomen as soon as possible. We will also arrange for you to see a gastrointestinal specialist in [facility] for further evaluation. Avoid strenuous activities until we have a clearer diagnosis. Use the Nurtec sample for your headaches and Flonase for any allergy-related symptoms. If you have any questions or if your symptoms worsen, please contact our office.                                    Contains text generated by [employer].

## 2023-10-12 NOTE — Telephone Encounter (Signed)
 Copied from CRM [id number]. Topic: Clinical - Request for Lab/Test Order >> Oct 12, 2023  1:09 PM Debby BROCKS wrote: Reason for CRM: Rueben from [facility] is calling on behalf of the patient. She states that the patient has a CT scan coming soon: abdominal and pelvis with contrast [facility] needs to know if he needs a prior authorization for it  [phone] Ext [phone]

## 2023-10-12 NOTE — Progress Notes (Signed)
 Subjective:  Patient ID: Grant Brady, male    DOB: [birth year]  Age: 33 y.o. MRN: [id number]  Chief Complaint  Patient presents with   New to establish   Discussed the use of AI scribe software for clinical note transcription with the patient, who gave verbal consent to proceed.  History of Present Illness   Grant Brady is a 33 year old male who presents with abdominal pain and blood in stool.  He has been experiencing blood in his stool for about a month and a half, with the onset of abdominal pain occurring approximately two weeks ago. The pain is localized to the left lower abdomen and occurs randomly or after eating or drinking. The blood is described as dark red. No family history of gastrointestinal issues such as IBS, Crohn's disease, or diverticulitis.  He experiences diarrhea and occasional nausea, which he has managed since childhood. He is concerned about the impact of his symptoms on his ability to exercise, particularly avoiding weighted squats due to the abdominal pain.  He experiences daily headaches, sometimes twice a day, described as bilateral with a slight predominance on one side. He uses Tylenol  for relief but tries to limit medication use. No nausea or light sensitivity, but noise exacerbates the headaches.  He has a history of asthma, which has been well-managed, especially since quitting smoking two years ago. He mentions a family history of heart disease on his mother's side. He lives in [place] and has lived there for most of his life, with some periods spent in other states.          10/12/2023    8:38 AM  Depression screen PHQ 2/9  Decreased Interest 0  Down, Depressed, Hopeless 0  PHQ - 2 Score 0         08/22/2020    1:26 PM 10/12/2023    8:38 AM  Fall Risk  Falls in the past year?  0  Was there an injury with Fall?  0  Fall Risk Category Calculator  0  (RETIRED) Patient Fall Risk Level Low fall risk    Patient at Risk for Falls Due to  No  Fall Risks  Fall risk Follow up  Falls evaluation completed     Data saved with a previous flowsheet row definition     No current outpatient medications on file prior to visit.   No current facility-administered medications on file prior to visit.  . Social History   Socioeconomic History   Marital status: Single    Spouse name: Not on file   Number of children: Not on file   Years of education: Not on file   Highest education level: Not on file  Occupational History   Not on file  Tobacco Use   Smoking status: Former    Current packs/day: 0.00    Types: Cigarettes    Quit date: 06/01/2020    Years since quitting: 3.3   Smokeless tobacco: Never  Substance and Sexual Activity   Alcohol use: Yes    Alcohol/week: 3.0 standard drinks of alcohol    Types: 3 Cans of beer per week   Drug use: Not Currently    Types: Marijuana    Comment: occasional   Sexual activity: Not Currently  Other Topics Concern   Not on file  Social History Narrative   Not on file   Social Drivers of Health   Financial Resource Strain: Not on file  Food Insecurity: Not on file  Transportation Needs: Not on file  Physical Activity: Not on file  Stress: Not on file  Social Connections: Not on file   Past Medical History:  Diagnosis Date   Asthma    History reviewed. No pertinent family history.  Review of Systems  Constitutional:  Negative for appetite change, fatigue and fever.  HENT:  Negative for congestion, ear pain, sinus pressure and sore throat.   Respiratory:  Negative for cough, chest tightness, shortness of breath and wheezing.   Cardiovascular:  Negative for chest pain and palpitations.  Gastrointestinal:  Positive for abdominal pain, blood in stool and diarrhea. Negative for constipation, nausea and vomiting.  Genitourinary:  Negative for dysuria and hematuria.  Musculoskeletal:  Negative for arthralgias, back pain, joint swelling and myalgias.  Skin:  Negative for rash.   Neurological:  Negative for dizziness, weakness and headaches.  Psychiatric/Behavioral:  Negative for dysphoric mood. The patient is not nervous/anxious.      Objective:  BP 122/82 (BP Location: Left Arm, Patient Position: Sitting)   Pulse 84   Temp (!) 97.3 F (36.3 C) (Temporal)   Ht 5' 7 (1.702 m)   Wt 180 lb (81.6 kg)   SpO2 98%   BMI 28.19 kg/m      10/12/2023    8:43 AM 08/22/2020    1:22 PM 06/18/2015    7:18 PM  BP/Weight  Systolic BP 122 135 122  Diastolic BP 82 86 79  Wt. (Lbs) 180    BMI 28.19 kg/m2      Physical Exam Constitutional:      General: He is not in acute distress.    Appearance: Normal appearance. He is not ill-appearing.  HENT:     Right Ear: Hearing and tympanic membrane normal. No decreased hearing noted. No laceration, drainage or tenderness.     Left Ear: Hearing normal. No decreased hearing noted. No laceration, drainage or tenderness. Tympanic membrane is erythematous.  Eyes:     Conjunctiva/sclera: Conjunctivae normal.  Cardiovascular:     Rate and Rhythm: Normal rate and regular rhythm.     Heart sounds: Normal heart sounds. No murmur heard. Pulmonary:     Effort: Pulmonary effort is normal.     Breath sounds: Normal breath sounds. No wheezing.  Abdominal:     General: Bowel sounds are normal.     Palpations: Abdomen is soft.     Tenderness: There is abdominal tenderness in the suprapubic area and left lower quadrant. There is guarding. There is no rebound.  Musculoskeletal:        General: Normal range of motion.     Cervical back: Normal range of motion.  Skin:    General: Skin is warm.  Neurological:     Mental Status: He is alert. Mental status is at baseline.  Psychiatric:        Mood and Affect: Mood normal.        Behavior: Behavior normal.     No results found for: WBC, HGB, HCT, PLT, GLUCOSE, CHOL, TRIG, HDL, LDLDIRECT, LDLCALC, ALT, AST, NA, K, CL, CREATININE, BUN, CO2, TSH,  PSA, INR, GLUF, HGBA1C, MICROALBUR    Assessment & Plan:  Blood in the stool Assessment & Plan: Melena with generalized abdominal pain and diarrhea Melena with dark red blood in stool, abdominal pain, and diarrhea for two weeks. Differential includes gastrointestinal bleeding. Further investigation needed. - Order hemoglobin level lab work. - Order stat CT scan of abdomen with contrast. - Refer to GI specialist in [facility]  for evaluation and possible colonoscopy. - Advise to avoid strenuous activities until diagnosis clarified. - Discuss potential use of papaya enzyme for digestive symptoms.  Orders: -     Ambulatory referral to Gastroenterology -     CT ABDOMEN PELVIS W CONTRAST; Future -     CBC with Differential/Platelet  Generalized abdominal pain Assessment & Plan: Abdominal pain - LLQ and suprapubic pain for 2 weeks sometimes related to food intake and sometimes unrelated.  Associated symptoms also include dark red blood in stool and diarrhea. Differential includes gastrointestinal bleeding. Further investigation needed. - Order hemoglobin level lab work. - Order stat CT scan of abdomen with contrast. - Refer to GI specialist in [facility] for evaluation and possible colonoscopy. - Advise to avoid strenuous activities until diagnosis clarified. - Discuss potential use of papaya enzyme for digestive symptoms.  Orders: -     Ambulatory referral to Gastroenterology -     CT ABDOMEN PELVIS W CONTRAST; Future -     Comprehensive metabolic panel with GFR -     Lipid panel  Bilateral headaches Assessment & Plan: Headache - almost daily Daily bilateral pressure headaches, worsened by noise. Possible factors: dehydration, B12 deficiency, caffeine withdrawal, allergies, stress. Prefers minimal medication. - Provide Nurtec sample for headache management. - Advise use of Flonase for potential allergy-related symptoms.  Orders: -     Nurtec; Take 1 tablet (75 mg  total) by mouth daily as needed.  History of asthma Assessment & Plan: Resolved since quitting smoking 2 years ago.    Encounter for screening for HIV -     HIV Antibody (routine testing w rflx)  Encounter for hepatitis C screening test for low risk patient -     Hepatitis C antibody     Meds ordered this encounter  Medications   Rimegepant Sulfate (NURTEC) 75 MG TBDP    Sig: Take 1 tablet (75 mg total) by mouth daily as needed.   Orders Placed This Encounter  Procedures   CT ABDOMEN PELVIS W CONTRAST   CBC with Differential   Comprehensive metabolic panel with GFR   Lipid Panel   HIV Antibody (routine testing w rflx)   Hepatitis C antibody   Ambulatory referral to Gastroenterology      Follow-up: Return in about 1 year (around 10/11/2024) for Annual Physical, lab visit, fasting.  AVS was given to patient prior to departure.   I,Lauren M Auman,acting as a scribe for Harrie CHRISTELLA Cedar, FNP.,have documented all relevant documentation on the behalf of Harrie CHRISTELLA Cedar, FNP,as directed by  Harrie CHRISTELLA Cedar, FNP while in the presence of Harrie CHRISTELLA Cedar, FNP.   I attest that I have reviewed this visit and agree with the plan scribed by my staff.   Harrie CHRISTELLA Cedar, FNP [facility] [phone]

## 2023-10-12 NOTE — Assessment & Plan Note (Signed)
 Headache - almost daily Daily bilateral pressure headaches, worsened by noise. Possible factors: dehydration, B12 deficiency, caffeine withdrawal, allergies, stress. Prefers minimal medication. - Provide Nurtec sample for headache management. - Advise use of Flonase for potential allergy-related symptoms.

## 2023-10-12 NOTE — Assessment & Plan Note (Signed)
 Abdominal pain - LLQ and suprapubic pain for 2 weeks sometimes related to food intake and sometimes unrelated.  Associated symptoms also include dark red blood in stool and diarrhea. Differential includes gastrointestinal bleeding. Further investigation needed. - Order hemoglobin level lab work. - Order stat CT scan of abdomen with contrast. - Refer to GI specialist in [facility] for evaluation and possible colonoscopy. - Advise to avoid strenuous activities until diagnosis clarified. - Discuss potential use of papaya enzyme for digestive symptoms.

## 2023-10-12 NOTE — Assessment & Plan Note (Signed)
 Melena with generalized abdominal pain and diarrhea Melena with dark red blood in stool, abdominal pain, and diarrhea for two weeks. Differential includes gastrointestinal bleeding. Further investigation needed. - Order hemoglobin level lab work. - Order stat CT scan of abdomen with contrast. - Refer to GI specialist in [facility] for evaluation and possible colonoscopy. - Advise to avoid strenuous activities until diagnosis clarified. - Discuss potential use of papaya enzyme for digestive symptoms.

## 2023-10-12 NOTE — Telephone Encounter (Signed)
 Spoke to patient regarding his CT results and explained that he needs a non emergent lower endoscopy and antibiotics for the proctocolitis. He stated that he is on the road in [place] and could not come in for a Rocephin shot tomorrow. Since he is out of state Dr. Sherre will send the 2 antibiotics to the pharmacy there.

## 2023-10-13 LAB — CBC WITH DIFFERENTIAL/PLATELET
Basophils Absolute: 0.1 x10E3/uL (ref 0.0–0.2)
Basos: 1 %
EOS (ABSOLUTE): 0.4 x10E3/uL (ref 0.0–0.4)
Eos: 4 %
Hematocrit: 47.5 % (ref 37.5–51.0)
Hemoglobin: 15.9 g/dL (ref 13.0–17.7)
Immature Grans (Abs): 0.1 x10E3/uL (ref 0.0–0.1)
Immature Granulocytes: 1 %
Lymphocytes Absolute: 1.7 x10E3/uL (ref 0.7–3.1)
Lymphs: 20 %
MCH: 31.2 pg (ref 26.6–33.0)
MCHC: 33.5 g/dL (ref 31.5–35.7)
MCV: 93 fL (ref 79–97)
Monocytes Absolute: 0.9 x10E3/uL (ref 0.1–0.9)
Monocytes: 11 %
Neutrophils Absolute: 5.2 x10E3/uL (ref 1.4–7.0)
Neutrophils: 63 %
Platelets: 315 x10E3/uL (ref 150–450)
RBC: 5.09 x10E6/uL (ref 4.14–5.80)
RDW: 11.9 % (ref 11.6–15.4)
WBC: 8.3 x10E3/uL (ref 3.4–10.8)

## 2023-10-13 LAB — COMPREHENSIVE METABOLIC PANEL WITH GFR
ALT: 76 IU/L — ABNORMAL HIGH (ref 0–44)
AST: 34 IU/L (ref 0–40)
Albumin: 4.7 g/dL (ref 4.3–5.2)
Alkaline Phosphatase: 105 IU/L (ref 44–121)
BUN/Creatinine Ratio: 7 — ABNORMAL LOW (ref 9–20)
BUN: 7 mg/dL (ref 6–20)
Bilirubin Total: 0.6 mg/dL (ref 0.0–1.2)
CO2: 23 mmol/L (ref 20–29)
Calcium: 9.5 mg/dL (ref 8.7–10.2)
Chloride: 100 mmol/L (ref 96–106)
Creatinine, Ser: 1.01 mg/dL (ref 0.76–1.27)
Globulin, Total: 2.4 g/dL (ref 1.5–4.5)
Glucose: 84 mg/dL (ref 70–99)
Potassium: 4.3 mmol/L (ref 3.5–5.2)
Sodium: 139 mmol/L (ref 134–144)
Total Protein: 7.1 g/dL (ref 6.0–8.5)
eGFR: 103 mL/min/1.73 (ref >59)

## 2023-10-13 LAB — HEPATITIS C ANTIBODY: Hep C Virus Ab: NONREACTIVE

## 2023-10-13 LAB — LIPID PANEL
Chol/HDL Ratio: 5.2 ratio — ABNORMAL HIGH (ref 0.0–5.0)
Cholesterol, Total: 172 mg/dL (ref 100–199)
HDL: 33 mg/dL — ABNORMAL LOW (ref >39)
LDL Chol Calc (NIH): 121 mg/dL — ABNORMAL HIGH (ref 0–99)
Triglycerides: 94 mg/dL (ref 0–149)
VLDL Cholesterol Cal: 18 mg/dL (ref 5–40)

## 2023-10-13 LAB — HIV ANTIBODY (ROUTINE TESTING W REFLEX): HIV Screen 4th Generation wRfx: NONREACTIVE

## 2023-10-21 ENCOUNTER — Encounter: Payer: Self-pay | Admitting: Gastroenterology

## 2023-10-21 ENCOUNTER — Other Ambulatory Visit: Payer: Self-pay | Admitting: Family Medicine

## 2023-10-21 DIAGNOSIS — G43009 Migraine without aura, not intractable, without status migrainosus: Secondary | ICD-10-CM

## 2023-10-21 MED ORDER — NURTEC 75 MG PO TBDP: 75.0000 mg | ORAL_TABLET | Freq: Every day | ORAL | 3 refills | Status: DC | PRN

## 2023-12-14 ENCOUNTER — Ambulatory Visit: Admitting: Gastroenterology

## 2023-12-14 ENCOUNTER — Other Ambulatory Visit (INDEPENDENT_AMBULATORY_CARE_PROVIDER_SITE_OTHER)

## 2023-12-14 ENCOUNTER — Other Ambulatory Visit

## 2023-12-14 ENCOUNTER — Encounter: Payer: Self-pay | Admitting: Gastroenterology

## 2023-12-14 VITALS — BP 100/68 | HR 86 | Ht 68.0 in | Wt 217.0 lb

## 2023-12-14 DIAGNOSIS — R194 Change in bowel habit: Secondary | ICD-10-CM | POA: Diagnosis not present

## 2023-12-14 DIAGNOSIS — K625 Hemorrhage of anus and rectum: Secondary | ICD-10-CM

## 2023-12-14 DIAGNOSIS — R9389 Abnormal findings on diagnostic imaging of other specified body structures: Secondary | ICD-10-CM

## 2023-12-14 DIAGNOSIS — R1030 Lower abdominal pain, unspecified: Secondary | ICD-10-CM | POA: Diagnosis not present

## 2023-12-14 LAB — SEDIMENTATION RATE: Sed Rate: 14 mm/h (ref 0–15)

## 2023-12-14 LAB — HIGH SENSITIVITY CRP: CRP, High Sensitivity: 60.55 mg/L — ABNORMAL HIGH (ref 0.000–5.000)

## 2023-12-14 NOTE — Patient Instructions (Addendum)
 Your provider has requested that you go to the basement level for lab work before leaving today. Press B on the elevator. The lab is located at the first door on the left as you exit the elevator.  Your provider has ordered Diatherix stool testing for you. You have received a kit from our office today containing all necessary supplies to complete this test. Please carefully read the stool collection instructions provided in the kit before opening the accompanying materials. In addition, be sure there is a label providing your full name and date of birth on the puritan opti-swab tube that is supplied in the kit (if you do not see a label with this information on your test tube, please make us  aware before test collection!). After completing the test, you should secure the purtian tube into the specimen biohazard bag. The [facility] E-Req sheet (including date and time of specimen collection) should be placed into the outside pocket of the specimen biohazard bag and returned to the [place] lab (basement floor of [facility]) within 3 days of collection. Please make sure to give the specimen to a staff member at the lab. DO NOT leave the specimen on the counter.   If the specimen date and time (can be found in the upper right boxed portion of the sheet) are not filled out on the E-Req sheet, the test will NOT be performed.    You have been scheduled for a colonoscopy. Please follow written instructions given to you at your visit today.   If you use inhalers (even only as needed), please bring them with you on the day of your procedure.  DO NOT TAKE 7 DAYS PRIOR TO TEST- Trulicity (dulaglutide) Ozempic, Wegovy (semaglutide) Mounjaro, Zepbound (tirzepatide) Bydureon Bcise (exanatide extended release)  DO NOT TAKE 1 DAY PRIOR TO YOUR TEST Rybelsus (semaglutide) Adlyxin (lixisenatide) Victoza (liraglutide) Byetta  (exanatide) ___________________________________________________________________________  _______________________________________________________  If your blood pressure at your visit was 140/90 or greater, please contact your primary care physician to follow up on this.  _______________________________________________________  If you are age 58 or older, your body mass index should be between 23-30. Your Body mass index is 32.99 kg/m. If this is out of the aforementioned range listed, please consider follow up with your Primary Care Provider.  If you are age 57 or younger, your body mass index should be between 19-25. Your Body mass index is 32.99 kg/m. If this is out of the aformentioned range listed, please consider follow up with your Primary Care Provider.   ________________________________________________________  The [facility] providers would like to encourage you to use MYCHART to communicate with providers for non-urgent requests or questions.  Due to long hold times on the telephone, sending your provider a message by [facility] may be a faster and more efficient way to get a response.  Please allow 48 business hours for a response.  Please remember that this is for non-urgent requests.  _______________________________________________________  [facility] is using a team-based approach to care.  Your team is made up of your doctor and two to three APPS. Our APPS (Nurse Practitioners and Physician Assistants) work with your physician to ensure care continuity for you. They are fully qualified to address your health concerns and develop a treatment plan. They communicate directly with your gastroenterologist to care for you. Seeing the Advanced Practice Practitioners on your physician's team can help you by facilitating care more promptly, often allowing for earlier appointments, access to diagnostic testing,  procedures, and other specialty referrals.

## 2023-12-14 NOTE — Progress Notes (Signed)
 Chief Complaint: Rectal bleeding, abdominal pain, diarrhea Primary GI MD: Sampson  HPI: Discussed the use of AI scribe software for clinical note transcription with the patient, who gave verbal consent to proceed.  Grant Brady is a 33 year old male who presents with blood in stool and abdominal pain.  Approximately two and a half months ago, he began experiencing blood in his stool. His bowel movements are predominantly diarrhea with occasional solid stools, yet a significant amount of blood remains present. He had a similar episode last year with blood in his stools and diarrhea, which resolved spontaneously after a few weeks.  About two months ago, he started experiencing a stabbing abdominal pain, initially localized to the left side, but it has since migrated to various locations. He describes the pain as feeling like 'a hook or something' inside him. The pain is intermittent, not associated with bowel movements, and does not improve with eating or fasting. Currently, he is not experiencing any pain.  He has been on two different antibiotics for a few weeks after a CT scan, but he did not notice any improvement with the antibiotics. His bowel habits vary, with some days of constipation and other days of frequent bowel movements, up to once every hour.  He is a naval architect, which complicates scheduling medical procedures, but he is willing to adjust his schedule for necessary tests. No family history of similar gastrointestinal issues.    Past Medical History:  Diagnosis Date   Asthma     History reviewed. No pertinent surgical history.  No current outpatient medications on file.   No current facility-administered medications for this visit.    Allergies as of 12/14/2023   (No Known Allergies)    Family History  Problem Relation Age of Onset   Heart disease Mother    Depression Mother    Colon polyps Mother    Iron deficiency Sister     Social History    Socioeconomic History   Marital status: Single    Spouse name: Not on file   Number of children: Not on file   Years of education: Not on file   Highest education level: Not on file  Occupational History   Not on file  Tobacco Use   Smoking status: Former    Current packs/day: 0.00    Types: Cigarettes    Quit date: 06/01/2020    Years since quitting: 3.5   Smokeless tobacco: Never  Substance and Sexual Activity   Alcohol use: Yes    Alcohol/week: 3.0 standard drinks of alcohol    Types: 3 Cans of beer per week   Drug use: Not Currently    Types: Marijuana    Comment: occasional   Sexual activity: Not Currently  Other Topics Concern   Not on file  Social History Narrative   Not on file   Social Drivers of Health   Financial Resource Strain: Not on file  Food Insecurity: Not on file  Transportation Needs: Not on file  Physical Activity: Not on file  Stress: Not on file  Social Connections: Not on file  Intimate Partner Violence: Not on file    Review of Systems:    Constitutional: No weight loss, fever, chills, weakness or fatigue HEENT: Eyes: No change in vision               Ears, Nose, Throat:  No change in hearing or congestion Skin: No rash or itching Cardiovascular: No chest pain, chest pressure  or palpitations   Respiratory: No SOB or cough Gastrointestinal: See HPI and otherwise negative Genitourinary: No dysuria or change in urinary frequency Neurological: No headache, dizziness or syncope Musculoskeletal: No new muscle or joint pain Hematologic: No bleeding or bruising Psychiatric: No history of depression or anxiety    Physical Exam:  Vital signs: BP 100/68   Pulse 86   Ht 5' 8 (1.727 m)   Wt 217 lb (98.4 kg)   SpO2 97%   BMI 32.99 kg/m   Constitutional: NAD, alert and cooperative Head:  Normocephalic and atraumatic. Eyes:   PEERL, EOMI. No icterus. Conjunctiva pink. Respiratory: Respirations even and unlabored. Lungs clear to  auscultation bilaterally.   No wheezes, crackles, or rhonchi.  Cardiovascular:  Regular rate and rhythm. No peripheral edema, cyanosis or pallor.  Gastrointestinal:  Soft, nondistended, tenderness in left side of abdomen on palpation. No rebound or guarding. Normal bowel sounds. No appreciable masses or hepatomegaly. Rectal:  Declines Msk:  Symmetrical without gross deformities. Without edema, no deformity or joint abnormality.  Neurologic:  Alert and  oriented x4;  grossly normal neurologically.  Skin:   Dry and intact without significant lesions or rashes. Psychiatric: Oriented to person, place and time. Demonstrates good judgement and reason without abnormal affect or behaviors.   RELEVANT LABS AND IMAGING: CBC    Component Value Date/Time   WBC 8.3 10/12/2023 0930   RBC 5.09 10/12/2023 0930   HGB 15.9 10/12/2023 0930   HCT 47.5 10/12/2023 0930   PLT 315 10/12/2023 0930   MCV 93 10/12/2023 0930   MCH 31.2 10/12/2023 0930   MCHC 33.5 10/12/2023 0930   RDW 11.9 10/12/2023 0930   LYMPHSABS 1.7 10/12/2023 0930   EOSABS 0.4 10/12/2023 0930   BASOSABS 0.1 10/12/2023 0930    CMP     Component Value Date/Time   NA 139 10/12/2023 0930   K 4.3 10/12/2023 0930   CL 100 10/12/2023 0930   CO2 23 10/12/2023 0930   GLUCOSE 84 10/12/2023 0930   BUN 7 10/12/2023 0930   CREATININE 1.01 10/12/2023 0930   CALCIUM 9.5 10/12/2023 0930   PROT 7.1 10/12/2023 0930   ALBUMIN 4.7 10/12/2023 0930   AST 34 10/12/2023 0930   ALT 76 (H) 10/12/2023 0930   ALKPHOS 105 10/12/2023 0930   BILITOT 0.6 10/12/2023 0930     Assessment/Plan:   Rectal bleeding Lower abdominal pain Diarrhea Rectal bleeding lower abdominal pain and diarrhea ongoing for the past 2 months with CT abdomen pelvis showing infectious versus inflammatory proctocolitis.  History of flare similar to this last year.  Was put on antibiotics by PCP without improvement.  No family history of IBD.  No previous colonoscopy.  CBC/CMP  unrevealing. - C. difficile stool study - Fecal calprotectin - Schedule colonoscopy with biopsies to rule out inflammatory bowel disease (ensure C. difficile is back prior to colonoscopy) - CRP/ESR - I thoroughly discussed the procedure with the patient (at bedside) to include nature of the procedure, alternatives, benefits, and risks (including but not limited to bleeding, infection, perforation, anesthesia/cardiac pulmonary complications).  Patient verbalized understanding and gave verbal consent to proceed with procedure.   Assigned to Dr. Stacia based on procedure availability  Nestor Mollie DEVONNA [facility] 12/14/2023, 10:01 AM  Cc: Teressa Harrie HERO, FNP

## 2023-12-15 ENCOUNTER — Ambulatory Visit: Payer: Self-pay | Admitting: Gastroenterology

## 2023-12-15 ENCOUNTER — Other Ambulatory Visit: Payer: Self-pay

## 2023-12-15 MED ORDER — NA SULFATE-K SULFATE-MG SULF 17.5-3.13-1.6 GM/177ML PO SOLN
ORAL | 0 refills | Status: AC
Start: 2023-12-15 — End: ?

## 2023-12-16 ENCOUNTER — Encounter: Payer: Self-pay | Admitting: Gastroenterology

## 2023-12-20 LAB — CALPROTECTIN: Calprotectin: 1340 ug/g — ABNORMAL HIGH

## 2023-12-23 ENCOUNTER — Ambulatory Visit: Admitting: Gastroenterology

## 2023-12-23 ENCOUNTER — Other Ambulatory Visit (INDEPENDENT_AMBULATORY_CARE_PROVIDER_SITE_OTHER)

## 2023-12-23 ENCOUNTER — Telehealth: Payer: Self-pay

## 2023-12-23 ENCOUNTER — Encounter: Payer: Self-pay | Admitting: Gastroenterology

## 2023-12-23 VITALS — BP 119/83 | HR 83 | Temp 97.4°F | Resp 30 | Ht 68.0 in | Wt 217.0 lb

## 2023-12-23 DIAGNOSIS — K5100 Ulcerative (chronic) pancolitis without complications: Secondary | ICD-10-CM | POA: Diagnosis not present

## 2023-12-23 DIAGNOSIS — K529 Noninfective gastroenteritis and colitis, unspecified: Secondary | ICD-10-CM

## 2023-12-23 DIAGNOSIS — R1030 Lower abdominal pain, unspecified: Secondary | ICD-10-CM

## 2023-12-23 DIAGNOSIS — K625 Hemorrhage of anus and rectum: Secondary | ICD-10-CM

## 2023-12-23 DIAGNOSIS — R9389 Abnormal findings on diagnostic imaging of other specified body structures: Secondary | ICD-10-CM

## 2023-12-23 DIAGNOSIS — R194 Change in bowel habit: Secondary | ICD-10-CM

## 2023-12-23 MED ORDER — PREDNISONE 5 MG PO TABS: ORAL_TABLET | ORAL | 0 refills | Status: DC

## 2023-12-23 MED ORDER — SODIUM CHLORIDE 0.9 % IV SOLN: 500.0000 mL | Freq: Once | INTRAVENOUS | Status: AC

## 2023-12-23 NOTE — Telephone Encounter (Signed)
 Pt scheduled to see Dr. Stacia 01/11/24 at 10:30am.

## 2023-12-23 NOTE — Progress Notes (Signed)
 Sedate, gd SR, tolerated procedure well, VSS, report to RN

## 2023-12-23 NOTE — Progress Notes (Signed)
 Pt's states no medical or surgical changes since previsit or office visit.

## 2023-12-23 NOTE — Telephone Encounter (Signed)
-----   Message from Glendia FORBES Holt sent at 12/23/2023 11:10 AM EST ----- Regarding: Follow up office visit Team,  Please book an office visit with me in 2-3 weeks to discuss new diagnosis of ulcerative colitis.  If nothing open, okay to add slot to end of a morning or afternoon clinic.

## 2023-12-23 NOTE — Op Note (Signed)
 [facility] Patient Name: Grant Brady Procedure Date: 12/23/2023 9:44 AM MRN: [id number] Endoscopist: Glendia E. Stacia , MD, [id number] Age: 33 Referring MD:  Date of Birth: 11-14-1990 Gender: Male Account #: [id number] Procedure:                Colonoscopy Indications:              Hematochezia, Suspected colitis Medicines:                Monitored Anesthesia Care Procedure:                Pre-Anesthesia Assessment:                           - Prior to the procedure, a History and Physical                            was performed, and patient medications and                            allergies were reviewed. The patient's tolerance of                            previous anesthesia was also reviewed. The risks                            and benefits of the procedure and the sedation                            options and risks were discussed with the patient.                            All questions were answered, and informed consent                            was obtained. Prior Anticoagulants: The patient has                            taken no anticoagulant or antiplatelet agents. ASA                            Grade Assessment: II - A patient with mild systemic                            disease. After reviewing the risks and benefits,                            the patient was deemed in satisfactory condition to                            undergo the procedure.                           After obtaining informed consent, the colonoscope  was passed under direct vision. Throughout the                            procedure, the patient's blood pressure, pulse, and                            oxygen saturations were monitored continuously. The                            Olympus Scope SN: [id number] was introduced through                            the anus and advanced to the the terminal ileum,                            with identification of  the appendiceal orifice and                            IC valve. The colonoscopy was performed without                            difficulty. The patient tolerated the procedure                            well. The quality of the bowel preparation was                            adequate. The terminal ileum, ileocecal valve,                            appendiceal orifice, and rectum were photographed.                            The bowel preparation used was SUPREP via split                            dose instruction. Scope In: 10:07:38 AM Scope Out: 10:22:57 AM Scope Withdrawal Time: 0 hours 11 minutes 42 seconds  Total Procedure Duration: 0 hours 15 minutes 19 seconds  Findings:                 The perianal and digital rectal examinations were                            normal. Pertinent negatives include normal                            sphincter tone and no palpable rectal lesions.                           Inflammation characterized by erythema, friability,                            loss of vascularity and mucus was found in a  continuous and circumferential pattern from the                            rectum to the ascending colon. The cecum was                            spared. The inflammation was graded as Mayo Score 2                            (moderate, with marked erythema, absent vascular                            pattern, friability, erosions), and when compared                            to previous examinations, the findings are new.                            Biopsies were taken with a cold forceps for                            histology. Estimated blood loss was minimal.                           The terminal ileum appeared normal. Complications:            No immediate complications. Estimated Blood Loss:     Estimated blood loss was minimal. Impression:               - Pancolitis ulcerative colitis. Inflammation was                             found from the rectum to the ascending colon. This                            was graded as Mayo Score 2 (moderate disease), new                            compared to previous examinations. Biopsied.                           - The examined portion of the ileum was normal. Recommendation:           - Patient has a contact number available for                            emergencies. The signs and symptoms of potential                            delayed complications were discussed with the                            patient. Return to normal activities tomorrow.  Written discharge instructions were provided to the                            patient.                           - Resume previous diet.                           - Continue present medications.                           - Await pathology results.                           - Use prednisone  40 mg PO once a day for 2 weeks,                            followed by 30 mg once daily for 1 week, followed                            by 20 mg once daily for 1 week, followed by 10 mg                            once daily for 1 week.                           - Follow up in the office in 2 weeks to discuss                            starting maintenance medications.                           - Obtain Quantiferon gold, hepatitis B serologies,                            TPMT. Sue Fernicola E. Stacia, MD 12/23/2023 10:34:34 AM This report has been signed electronically.

## 2023-12-23 NOTE — Progress Notes (Signed)
 Called to room to assist during endoscopic procedure.  Patient ID and intended procedure confirmed with present staff. Received instructions for my participation in the procedure from the performing physician.

## 2023-12-23 NOTE — Progress Notes (Signed)
 History and Physical Interval Note:  12/23/2023 9:57 AM  [place] JINNY Sharps  has presented today for endoscopic procedure(s), with the diagnosis of  Encounter Diagnosis  Name Primary?   Rectal bleeding Yes  .  The various methods of evaluation and treatment have been discussed with the patient and/or family. After consideration of risks, benefits and other options for treatment, the patient has consented to  the endoscopic procedure(s).   The patient's history has been reviewed, patient examined, no change in status, stable for endoscopic procedure(s).  I have reviewed the patient's chart and labs.  Questions were answered to the patient's satisfaction.     Karlena Luebke E. Stacia, MD [facility]

## 2023-12-23 NOTE — Patient Instructions (Signed)
 YOU HAD AN ENDOSCOPIC PROCEDURE TODAY AT THE [facility]:   Refer to the procedure report that was given to you for any specific questions about what was found during the examination.  If the procedure report does not answer your questions, please call your gastroenterologist to clarify.  If you requested that your care partner not be given the details of your procedure findings, then the procedure report has been included in a sealed envelope for you to review at your convenience later.  YOU SHOULD EXPECT: Some feelings of bloating in the abdomen. Passage of more gas than usual.  Walking can help get rid of the air that was put into your GI tract during the procedure and reduce the bloating. If you had a lower endoscopy (such as a colonoscopy or flexible sigmoidoscopy) you may notice spotting of blood in your stool or on the toilet paper. If you underwent a bowel prep for your procedure, you may not have a normal bowel movement for a few days.  Please Note:  You might notice some irritation and congestion in your nose or some drainage.  This is from the oxygen used during your procedure.  There is no need for concern and it should clear up in a day or so.  SYMPTOMS TO REPORT IMMEDIATELY:  Following lower endoscopy (colonoscopy or flexible sigmoidoscopy):  Excessive amounts of blood in the stool  Significant tenderness or worsening of abdominal pains  Swelling of the abdomen that is new, acute  Fever of 100F or higher   For urgent or emergent issues, a gastroenterologist can be reached at any hour by calling [phone]. Do not use MyChart messaging for urgent concerns.    DIET:  We do recommend a small meal at first, but then you may proceed to your regular diet.  Drink plenty of fluids but you should avoid alcoholic beverages for 24 hours.  MEDICATIONS: Continue present medications. Use Prednisone  40 mg once daily for 2 weeks, followed by 30 mg once daily for 1 week, followed  by 20 mg once daily for 1 week, followed by 10 mg once daily for 1 week (tapering dose).  FOLLOW UP: Follow up in the office in 2 weeks to discuss starting maintenance medications. Dr. London office nurse will call you to set up this appointment. Obtain Quantiferon gold, hepatitis B serologies, TPMT. Patient taken to lab for these blood draws prior to discharge home.  Thank you for allowing us  to provide for your healthcare needs today.  ACTIVITY:  You should plan to take it easy for the rest of today and you should NOT DRIVE or use heavy machinery until tomorrow (because of the sedation medicines used during the test).    FOLLOW UP: Our staff will call the number listed on your records the next business day following your procedure.  We will call around 7:15- 8:00 am to check on you and address any questions or concerns that you may have regarding the information given to you following your procedure. If we do not reach you, we will leave a message.     If any biopsies were taken you will be contacted by phone or by letter within the next 1-3 weeks.  Please call us  at [phone] if you have not heard about the biopsies in 3 weeks.    SIGNATURES/CONFIDENTIALITY: You and/or your care partner have signed paperwork which will be entered into your electronic medical record.  These signatures attest to the fact that that the  information above on your After Visit Summary has been reviewed and is understood.  Full responsibility of the confidentiality of this discharge information lies with you and/or your care-partner.

## 2023-12-24 LAB — HEPATITIS B SURFACE ANTIGEN: Hepatitis B Surface Ag: NONREACTIVE

## 2023-12-24 LAB — HEPATITIS C ANTIBODY: Hepatitis C Ab: NONREACTIVE

## 2023-12-24 LAB — HEPATITIS B SURFACE ANTIBODY,QUALITATIVE: Hep B S Ab: NONREACTIVE

## 2023-12-24 LAB — HEPATITIS B CORE ANTIBODY, TOTAL: Hep B Core Total Ab: NONREACTIVE

## 2023-12-26 ENCOUNTER — Telehealth: Payer: Self-pay

## 2023-12-26 NOTE — Telephone Encounter (Signed)
  Follow up Call-     12/23/2023    9:37 AM  Call back number  Post procedure Call Back phone  # [phone]  Permission to leave phone message Yes     Patient questions:  Do you have a fever, pain , or abdominal swelling? No. Pain Score  0 *  Have you tolerated food without any problems? Yes.    Have you been able to return to your normal activities? Yes.    Do you have any questions about your discharge instructions: Diet   No. Medications  No. Follow up visit  No.  Do you have questions or concerns about your Care? No.  Actions: * If pain score is 4 or above: No action needed, pain <4.

## 2023-12-27 LAB — SURGICAL PATHOLOGY

## 2023-12-31 ENCOUNTER — Ambulatory Visit: Payer: Self-pay | Admitting: Gastroenterology

## 2023-12-31 NOTE — Progress Notes (Signed)
 Lynwood,  Testing showed that you have not been exposed to, nor are you immune to hepatitis B.  I recommend you undergo Hepatitis B vaccination.  We can administer this vaccine at our office.  Will discuss further at your follow up visit in a few weeks.

## 2024-01-04 MED ORDER — PREDNISONE 10 MG PO TABS: ORAL_TABLET | ORAL | 0 refills | Status: AC

## 2024-01-11 ENCOUNTER — Other Ambulatory Visit (INDEPENDENT_AMBULATORY_CARE_PROVIDER_SITE_OTHER)

## 2024-01-11 ENCOUNTER — Encounter: Payer: Self-pay | Admitting: Gastroenterology

## 2024-01-11 ENCOUNTER — Ambulatory Visit: Admitting: Gastroenterology

## 2024-01-11 VITALS — BP 130/68 | HR 94 | Ht 68.0 in | Wt 216.0 lb

## 2024-01-11 DIAGNOSIS — Z23 Encounter for immunization: Secondary | ICD-10-CM

## 2024-01-11 DIAGNOSIS — K5100 Ulcerative (chronic) pancolitis without complications: Secondary | ICD-10-CM

## 2024-01-11 MED ORDER — MESALAMINE 1.2 G PO TBEC: DELAYED_RELEASE_TABLET | ORAL | 3 refills | Status: AC

## 2024-01-11 NOTE — Progress Notes (Signed)
 Discussed the use of AI scribe software for clinical note transcription with the patient, who gave verbal consent to proceed.  HPI : Grant Brady is a 33 y.o. male with recently diagnosed ulcerative colitis who presents for follow-up after his initial colonoscopy to discuss his new diagnosis and maintenance treatment options.  He was initially seen in our office by Con Blower in November with 2-1/2 months of persistent abdominal pain and bloody diarrhea.  He underwent a colonoscopy November 21 which showed pancolitis moderate to severe in severity.  He was started on a steroid taper.  Since starting steroids, he has experienced significant improvement in symptoms, with no visible bleeding for the past one and a half to two weeks.  He is currently on 30 mg daily.  Abdominal pain has resolved, with only a brief episode occurring once in the last few days. Bowel movements have decreased in frequency from three to four times daily to twice in the last 24 hours. He has increased hunger as a side effect of the steroids but no other adverse effects.  He is seeking information on dietary modifications to manage inflammation associated with ulcerative colitis. He is particularly interested in the impact of spicy foods, red meats, and dairy products on his condition. He does not consume alcohol and is not lactose intolerant.  His current medication regimen includes steroids. He is interested in minimizing medication use and prefers oral medications over injectables due to a dislike of injections.  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      Colonoscopy December 23, 2023 - Pancolitis ulcerative colitis. Inflammation was found from the rectum to the ascending colon. This was graded as Mayo Score 2 (moderate disease), new compared to previous examinations. Biopsied. - The examined portion of the ileum was normal. FINAL DIAGNOSIS        1. Surgical [P],  right colon :       - CHRONIC ACTIVE COLITIS WITH MILD ACTIVITY.       - NEGATIVE FOR DYSPLASIA.        2. Surgical [P], left colon and rectum :       - CHRONIC ACTIVE COLITIS WITH MODERATE ACTIVITY.       - NEGATIVE FOR DYSPLASIA   Fecal calprotectin December 14, 2023: 1300 Hepatitis serologies revealed no prior exposure to hepatitis B or C.  Surface antibody negative indicating he is not immune to hep B. QuantiFERON gold not processed   Past Medical History:  Diagnosis Date   Asthma      No past surgical history on file. Family History  Problem Relation Age of Onset   Heart disease Mother    Depression Mother    Colon polyps Mother    Iron deficiency Sister    Colon cancer Neg Hx    Esophageal cancer Neg Hx    Rectal cancer Neg Hx    Stomach cancer Neg Hx    Social History   Tobacco Use   Smoking status: Former    Current packs/day: 0.00    Types: Cigarettes    Quit date: 06/01/2020    Years since quitting: 3.6   Smokeless tobacco: Never  Vaping Use   Vaping status: Never Used  Substance Use Topics   Alcohol use: Not Currently    Alcohol/week: 3.0 standard drinks of alcohol    Types: 3 Cans of beer per week    Comment: SOBER for 2 years now  Drug use: Not Currently    Types: Marijuana    Comment: 5 years ago, NO CURRENT MJ USE   Current Outpatient Medications  Medication Sig Dispense Refill   Na Sulfate-K Sulfate-Mg Sulfate concentrate (SUPREP) 17.5-3.13-1.6 GM/177ML SOLN Take as directed 354 mL 0   predniSONE  (DELTASONE ) 10 MG tablet Take 3 tablets (30 mg total) by mouth daily with breakfast for 7 days, THEN 2 tablets (20 mg total) daily with breakfast for 7 days, THEN 1 tablet (10 mg total) daily with breakfast for 7 days. 42 tablet 0   Current Facility-Administered Medications  Medication Dose Route Frequency Provider Last Rate Last Admin   0.9 %  sodium chloride  infusion  500 mL Intravenous Once Honour Schwieger E, MD       No Known  Allergies   Review of Systems: All systems reviewed and negative except where noted in HPI.    No results found.  Physical Exam: BP 130/68   Pulse 94   Ht 5' 8 (1.727 m)   Wt 216 lb (98 kg)   SpO2 98%   BMI 32.84 kg/m  Constitutional: Pleasant,well-developed, Caucasian male in no acute distress. HEENT: Normocephalic and atraumatic. Conjunctivae are normal. No scleral icterus. Neck supple.  Cardiovascular: Normal rate, regular rhythm.  Pulmonary/chest: Effort normal and breath sounds normal. No wheezing, rales or rhonchi. Abdominal: Soft, nondistended, nontender. Bowel sounds active throughout. There are no masses palpable. No hepatomegaly. Extremities: no edema Neurological: Alert and oriented to person place and time. Skin: Skin is warm and dry. No rashes noted. Psychiatric: Normal mood and affect. Behavior is normal.  CBC    Component Value Date/Time   WBC 8.3 10/12/2023 0930   RBC 5.09 10/12/2023 0930   HGB 15.9 10/12/2023 0930   HCT 47.5 10/12/2023 0930   PLT 315 10/12/2023 0930   MCV 93 10/12/2023 0930   MCH 31.2 10/12/2023 0930   MCHC 33.5 10/12/2023 0930   RDW 11.9 10/12/2023 0930   LYMPHSABS 1.7 10/12/2023 0930   EOSABS 0.4 10/12/2023 0930   BASOSABS 0.1 10/12/2023 0930    CMP     Component Value Date/Time   NA 139 10/12/2023 0930   K 4.3 10/12/2023 0930   CL 100 10/12/2023 0930   CO2 23 10/12/2023 0930   GLUCOSE 84 10/12/2023 0930   BUN 7 10/12/2023 0930   CREATININE 1.01 10/12/2023 0930   CALCIUM 9.5 10/12/2023 0930   PROT 7.1 10/12/2023 0930   ALBUMIN 4.7 10/12/2023 0930   AST 34 10/12/2023 0930   ALT 76 (H) 10/12/2023 0930   ALKPHOS 105 10/12/2023 0930   BILITOT 0.6 10/12/2023 0930       Latest Ref Rng & Units 10/12/2023    9:30 AM  CBC EXTENDED  WBC 3.4 - 10.8 x10E3/uL 8.3   RBC 4.14 - 5.80 x10E6/uL 5.09   Hemoglobin 13.0 - 17.7 g/dL 84.0   HCT 62.4 - 48.9 % 47.5   Platelets 150 - 450 x10E3/uL 315   NEUT# 1.4 - 7.0 x10E3/uL 5.2    Lymph# 0.7 - 3.1 x10E3/uL 1.7       ASSESSMENT AND PLAN:  33 year old male with recently diagnosed pan ulcerative colitis, with rapid symptom improvement after steroid initiation.  We discussed the proposed pathophysiology of ulcerative colitis, the concept of a genetic predisposition and a second hit hypothesis.  We discussed the chronic nature of this disease in the absence of a cure.  He is aware that he will have this condition essentially for the rest  of his life, and that he will need to take medications to keep his disease under control.  We discussed the different medications that can be used to control ulcerative colitis, and that the medications chosen typically are based on the severity of the disease at presentation.  These medications will need to be taken continuously.  Gaps in medication adherence commonly lead to flares of disease. He is aware that steroids are used as a temporary medication to get inflammation quickly under control, but that we need to transition to a maintenance medication that he will take indefinitely to manage his disease. We discussed the increased risk of colon cancer in patients with ulcerative colitis, and the need for surveillance colonoscopies starting at 8 years after the age of diagnosis. We discussed medications that may increase the risk of having a flare of ulcerative colitis, namely antibiotics and NSAIDs. We discussed how medications that he may require in the future to control his ulcerative colitis can impact his immune system, and additional vaccines are recommended. We discussed the role of diet and ulcerative colitis, and the lack of clear evidence on exactly how diet affects ulcerative colitis, but recent guidance suggest a Mediterranean diet is the preferred dietary recommendation.  I emphasized the importance of limiting consumption of ultra processed foods, red meat, and sugary foods.  Although his endoscopic findings were more in the  moderate to severe category, his symptoms and rapid response to steroids may be better characterized as mild to moderate.  We discussed how it may be reasonable to be more aggressive and start a biologic therapy, or we could try mesalamine first given his rapid symptom improvement. We discussed how mesalamine is generally well-tolerated, but sometimes can produce a paradoxic reaction.  We discussed the rare possibility of renal dysfunction, and the recommendation to check renal function annually.  The patient would strongly prefer to avoid any injectable or infusion medicines if at all possible.  I think it is reasonable to try him on mesalamine, but if his symptoms return or if he requires recurrent steroids, we will need to escalate to a biologic.  Ulcerative pancolitis - Continue steroid taper, decreasing by 10 mg every 7 days until 0 mg - Start Lialda 1.2 g tablets.  Take 4 tablets by mouth once a day for 4 weeks, then decrease to 2 tablets once daily - Recommended Mediterranean diet - Will obtain Pneumovax and hepatitis B vaccinations today - Consider shingles vaccination at a later time - Reorder QuantiFERON gold (was not processed previously) - Obtain TPMT - Plan to repeat fecal calprotectin in 3-4 months  Follow-up in 4 months, sooner if symptoms return.  Recording duration: 22 minutes     I spent a total of 40 minutes reviewing the patient's medical record, interviewing and examining the patient, discussing his diagnosis and management of his condition going forward, and documenting in the medical record  Dakin Madani E. Stacia, MD [facility]     Teressa Harrie HERO, FNP

## 2024-01-11 NOTE — Patient Instructions (Addendum)
 We have sent the following medications to your pharmacy for you to pick up at your convenience: Lialda taking 4 tablets by mouth daily x 4 weeks, then reduce to 2 tablets by mouth daily long-term.   Your provider has requested that you go to the basement level for lab work before leaving today. Press B on the elevator. The lab is located at the first door on the left as you exit the elevator.  We have given you a pneumovax vaccination and a hepatitis B vaccination. You do have to come back to have your next hepatitis B injection on 02/15/24 at 10:30 am.   _______________________________________________________  If your blood pressure at your visit was 140/90 or greater, please contact your primary care physician to follow up on this.  _______________________________________________________  If you are age 31 or older, your body mass index should be between 23-30. Your Body mass index is 32.84 kg/m. If this is out of the aforementioned range listed, please consider follow up with your Primary Care Provider.  If you are age 80 or younger, your body mass index should be between 19-25. Your Body mass index is 32.84 kg/m. If this is out of the aformentioned range listed, please consider follow up with your Primary Care Provider.   ________________________________________________________  The [facility] providers would like to encourage you to use MYCHART to communicate with providers for non-urgent requests or questions.  Due to long hold times on the telephone, sending your provider a message by [facility] may be a faster and more efficient way to get a response.  Please allow 48 business hours for a response.  Please remember that this is for non-urgent requests.  _______________________________________________________  [facility] is using a team-based approach to care.  Your team is made up of your doctor and two to three APPS. Our APPS (Nurse Practitioners and Physician Assistants)  work with your physician to ensure care continuity for you. They are fully qualified to address your health concerns and develop a treatment plan. They communicate directly with your gastroenterologist to care for you. Seeing the Advanced Practice Practitioners on your physician's team can help you by facilitating care more promptly, often allowing for earlier appointments, access to diagnostic testing, procedures, and other specialty referrals.

## 2024-01-20 LAB — THIOPURINE METHYLTRANSFERASE (TPMT), RBC: Thiopurine Methyltransferase, RBC: 20 nmol/h/mL

## 2024-01-20 LAB — QUANTIFERON-TB GOLD PLUS
Mitogen-NIL: 7.14 [IU]/mL
NIL: 0.03 [IU]/mL
QuantiFERON-TB Gold Plus: NEGATIVE
TB1-NIL: 0 [IU]/mL
TB2-NIL: 0 [IU]/mL

## 2024-02-15 ENCOUNTER — Ambulatory Visit (INDEPENDENT_AMBULATORY_CARE_PROVIDER_SITE_OTHER)

## 2024-02-15 DIAGNOSIS — Z23 Encounter for immunization: Secondary | ICD-10-CM | POA: Diagnosis not present
